# Patient Record
Sex: Male | Born: 1965 | Race: Black or African American | Hispanic: No | Marital: Single | Smoking: Current every day smoker
Health system: Southern US, Community
[De-identification: ages and names within clinical notes are randomized; demographics above are authoritative.]

## PROBLEM LIST (undated history)

## (undated) DIAGNOSIS — K219 Gastro-esophageal reflux disease without esophagitis: Secondary | ICD-10-CM

## (undated) DIAGNOSIS — Z8639 Personal history of other endocrine, nutritional and metabolic disease: Secondary | ICD-10-CM

## (undated) DIAGNOSIS — D649 Anemia, unspecified: Secondary | ICD-10-CM

## (undated) DIAGNOSIS — N185 Chronic kidney disease, stage 5: Secondary | ICD-10-CM

## (undated) DIAGNOSIS — R9431 Abnormal electrocardiogram [ECG] [EKG]: Secondary | ICD-10-CM

## (undated) DIAGNOSIS — I10 Essential (primary) hypertension: Secondary | ICD-10-CM

## (undated) DIAGNOSIS — N189 Chronic kidney disease, unspecified: Secondary | ICD-10-CM

## (undated) DIAGNOSIS — N179 Acute kidney failure, unspecified: Secondary | ICD-10-CM

## (undated) HISTORY — PX: ROTATOR CUFF REPAIR: SHX139

## (undated) HISTORY — DX: Chronic kidney disease, stage 5: N18.5

## (undated) HISTORY — DX: Personal history of other endocrine, nutritional and metabolic disease: Z86.39

## (undated) HISTORY — DX: Gastro-esophageal reflux disease without esophagitis: K21.9

## (undated) HISTORY — DX: Abnormal electrocardiogram (ECG) (EKG): R94.31

## (undated) HISTORY — PX: HERNIA REPAIR: SHX51

## (undated) HISTORY — DX: Anemia, unspecified: D64.9

## (undated) HISTORY — DX: Chronic kidney disease, unspecified: N18.9

## (undated) HISTORY — DX: Acute kidney failure, unspecified: N17.9

## (undated) HISTORY — PX: PORTA CATH REMOVAL: CATH118286

---

## 2011-03-30 ENCOUNTER — Emergency Department (HOSPITAL_BASED_OUTPATIENT_CLINIC_OR_DEPARTMENT_OTHER)
Admission: EM | Admit: 2011-03-30 | Discharge: 2011-03-30 | Disposition: A | Discharge status: Home / Self Care | Payer: No Typology Code available for payment source | Attending: Emergency Medicine | Admitting: Emergency Medicine

## 2011-03-30 ENCOUNTER — Encounter (HOSPITAL_BASED_OUTPATIENT_CLINIC_OR_DEPARTMENT_OTHER): Payer: Self-pay | Admitting: Emergency Medicine

## 2011-03-30 ENCOUNTER — Emergency Department (INDEPENDENT_AMBULATORY_CARE_PROVIDER_SITE_OTHER): Payer: No Typology Code available for payment source

## 2011-03-30 DIAGNOSIS — S0120XA Unspecified open wound of nose, initial encounter: Secondary | ICD-10-CM

## 2011-03-30 DIAGNOSIS — Y92410 Unspecified street and highway as the place of occurrence of the external cause: Secondary | ICD-10-CM | POA: Insufficient documentation

## 2011-03-30 DIAGNOSIS — Z043 Encounter for examination and observation following other accident: Secondary | ICD-10-CM

## 2011-03-30 DIAGNOSIS — S01502A Unspecified open wound of oral cavity, initial encounter: Secondary | ICD-10-CM | POA: Insufficient documentation

## 2011-03-30 DIAGNOSIS — R6884 Jaw pain: Secondary | ICD-10-CM

## 2011-03-30 DIAGNOSIS — S01501A Unspecified open wound of lip, initial encounter: Secondary | ICD-10-CM | POA: Insufficient documentation

## 2011-03-30 DIAGNOSIS — S01511A Laceration without foreign body of lip, initial encounter: Secondary | ICD-10-CM

## 2011-03-30 DIAGNOSIS — S01512A Laceration without foreign body of oral cavity, initial encounter: Secondary | ICD-10-CM

## 2011-03-30 DIAGNOSIS — F17200 Nicotine dependence, unspecified, uncomplicated: Secondary | ICD-10-CM | POA: Insufficient documentation

## 2011-03-30 MED ORDER — AMOXICILLIN 500 MG PO CAPS: 500.0000 mg | ORAL_CAPSULE | Freq: Three times a day (TID) | ORAL | Status: AC

## 2011-03-30 MED ORDER — LIDOCAINE-EPINEPHRINE-TETRACAINE (LET) SOLUTION
3.0000 mL | Freq: Once | NASAL | Status: AC
  Administered 2011-03-30: 3 mL via TOPICAL
  Filled 2011-03-30: qty 3

## 2011-03-30 MED ORDER — OXYCODONE-ACETAMINOPHEN 5-325 MG PO TABS
2.0000 | ORAL_TABLET | Freq: Once | ORAL | Status: AC
  Administered 2011-03-30: 2 via ORAL
  Filled 2011-03-30: qty 2

## 2011-03-30 MED ORDER — OXYCODONE-ACETAMINOPHEN 5-325 MG PO TABS: 2.0000 | ORAL_TABLET | ORAL | Status: AC | PRN
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
## 2011-04-02 NOTE — ED Provider Notes (Signed)
Medical screening examination/treatment/procedure(s) were performed by non-physician practitioner and as supervising physician I was immediately available for consultation/collaboration.   Mervin Kung, MD 04/02/11 2006

## 2012-06-24 ENCOUNTER — Emergency Department (HOSPITAL_COMMUNITY)
Admission: EM | Admit: 2012-06-24 | Discharge: 2012-06-24 | Disposition: A | Discharge status: Home / Self Care | Payer: Managed Care, Other (non HMO) | Attending: Emergency Medicine | Admitting: Emergency Medicine

## 2012-06-24 ENCOUNTER — Emergency Department (HOSPITAL_COMMUNITY): Payer: Managed Care, Other (non HMO)

## 2012-06-24 ENCOUNTER — Encounter (HOSPITAL_COMMUNITY): Payer: Self-pay | Admitting: *Deleted

## 2012-06-24 DIAGNOSIS — R209 Unspecified disturbances of skin sensation: Secondary | ICD-10-CM | POA: Insufficient documentation

## 2012-06-24 DIAGNOSIS — N289 Disorder of kidney and ureter, unspecified: Secondary | ICD-10-CM | POA: Insufficient documentation

## 2012-06-24 DIAGNOSIS — I10 Essential (primary) hypertension: Secondary | ICD-10-CM | POA: Insufficient documentation

## 2012-06-24 DIAGNOSIS — R200 Anesthesia of skin: Secondary | ICD-10-CM

## 2012-06-24 DIAGNOSIS — F17200 Nicotine dependence, unspecified, uncomplicated: Secondary | ICD-10-CM | POA: Insufficient documentation

## 2012-06-24 HISTORY — DX: Essential (primary) hypertension: I10

## 2012-06-24 LAB — COMPREHENSIVE METABOLIC PANEL
ALT: 30 U/L (ref 0–53)
AST: 32 U/L (ref 0–37)
Albumin: 3.4 g/dL — ABNORMAL LOW (ref 3.5–5.2)
Alkaline Phosphatase: 65 U/L (ref 39–117)
BUN: 12 mg/dL (ref 6–23)
CO2: 27 mEq/L (ref 19–32)
Calcium: 9.4 mg/dL (ref 8.4–10.5)
Chloride: 103 mEq/L (ref 96–112)
Creatinine, Ser: 1.68 mg/dL — ABNORMAL HIGH (ref 0.50–1.35)
GFR calc Af Amer: 55 mL/min — ABNORMAL LOW (ref >90)
GFR calc non Af Amer: 48 mL/min — ABNORMAL LOW (ref >90)
Glucose, Bld: 78 mg/dL (ref 70–99)
Potassium: 4.4 mEq/L (ref 3.5–5.1)
Sodium: 139 mEq/L (ref 135–145)
Total Bilirubin: 0.3 mg/dL (ref 0.3–1.2)
Total Protein: 7.9 g/dL (ref 6.0–8.3)

## 2012-06-24 LAB — GLUCOSE, CAPILLARY: Glucose-Capillary: 86 mg/dL (ref 70–99)

## 2012-06-24 LAB — CBC
HCT: 48.1 % (ref 39.0–52.0)
Hemoglobin: 16 g/dL (ref 13.0–17.0)
MCH: 27 pg (ref 26.0–34.0)
MCHC: 33.3 g/dL (ref 30.0–36.0)
MCV: 81.3 fL (ref 78.0–100.0)
Platelets: 207 10*3/uL (ref 150–400)
RBC: 5.92 MIL/uL — ABNORMAL HIGH (ref 4.22–5.81)
RDW: 14.6 % (ref 11.5–15.5)
WBC: 5.2 10*3/uL (ref 4.0–10.5)

## 2012-06-24 LAB — DIFFERENTIAL
Basophils Absolute: 0 10*3/uL (ref 0.0–0.1)
Basophils Relative: 1 % (ref 0–1)
Eosinophils Absolute: 0.2 10*3/uL (ref 0.0–0.7)
Eosinophils Relative: 3 % (ref 0–5)
Lymphocytes Relative: 41 % (ref 12–46)
Lymphs Abs: 2.1 10*3/uL (ref 0.7–4.0)
Monocytes Absolute: 0.5 10*3/uL (ref 0.1–1.0)
Monocytes Relative: 10 % (ref 3–12)
Neutro Abs: 2.4 10*3/uL (ref 1.7–7.7)
Neutrophils Relative %: 46 % (ref 43–77)

## 2012-06-24 LAB — PROTIME-INR
INR: 0.88 (ref 0.00–1.49)
Prothrombin Time: 11.9 seconds (ref 11.6–15.2)

## 2012-06-24 LAB — ETHANOL: Alcohol, Ethyl (B): <11 mg/dL (ref 0–11)

## 2012-06-24 LAB — POCT I-STAT TROPONIN I

## 2012-06-24 LAB — APTT: aPTT: 30 seconds (ref 24–37)

## 2012-06-24 LAB — TROPONIN I: Troponin I: <0.3 ng/mL (ref <0.30)

## 2012-06-24 MED ORDER — HYDROCHLOROTHIAZIDE 12.5 MG PO TABS: 12.5000 mg | ORAL_TABLET | Freq: Every day | ORAL | Status: DC

## 2012-06-24 MED ORDER — ASPIRIN 81 MG PO CHEW: 325.0000 mg | CHEWABLE_TABLET | Freq: Every day | ORAL | Status: DC

## 2012-06-24 NOTE — ED Notes (Signed)
Past stroke shallow screen

## 2012-06-24 NOTE — ED Notes (Signed)
Pt states yesterday morning early (2:45 am) was at work when his L hand went numb then his arm went numb, states lasted about 3-4 seconds, also states L side of face felt "heavy", states happen only that one time for the 3-4 seconds, states has been told has high blood pressure and has been threatened with medication but has been trying to control w/ diet and exercise. Denies n/v, denies shortness of breath/dizziness.

## 2012-06-24 NOTE — ED Provider Notes (Signed)
Medical screening examination/treatment/procedure(s) were conducted as a shared visit with non-physician practitioner(s) and myself.  I personally evaluated the patient during the encounter.  47yo male asymptomatic today, several seconds yesterday left face/arm numbness, doubt SAH/CVA/HTN crisis, noted to have elevated BP, renal insufficiency, and abnormal ECG, [facility] Kindred recs [facility] Neuro f/u low likelihood TIA, might even have been focal seizure but uncertain.  ECG: Sinus rhythm, ventricular rate 56, normal axis, RSR prime in lead V1, nonspecific T wave abnormality laterally, slight diffuse ST elevation precordial leads, no comparison ECG available, clinically doubt acute coronary syndrome or STEMI  Babette Relic, MD 06/25/12 1436

## 2012-06-24 NOTE — ED Notes (Signed)
Patient transported to CT 

## 2012-06-24 NOTE — ED Notes (Signed)
[id number] Expected date:<BR> Expected time:<BR> Means of arrival:<BR> Comments:<BR> ems-hallucinations

## 2012-06-24 NOTE — ED Provider Notes (Signed)
History     CSN: [id number]  Arrival date & time 06/24/12  1246   First MD Initiated Contact with Patient 06/24/12 1346      Chief Complaint  Patient presents with  . Numbness    (Consider location/radiation/quality/duration/timing/severity/associated sxs/prior treatment) HPI Pt is a 47yo male presenting after brief episode of left sided face numbness and left hand and arm numbness that occurred yesterday.  Episode occurred at work around 02:45 in the morning, only lasted 3-4 seconds.  Pt has been told in past he has high blood pressure but is not on any medications.  Has tried to control with diet and exercise.  Does not have a regular PCP.  Denies any symptoms at this time.  Denies n/v, headache, dizziness, chest pain or SOB.    Past Medical History  Diagnosis Date  . Hypertension     Past Surgical History  Procedure Laterality Date  . Hernia repair    . Rotator cuff repair      History reviewed. No pertinent family history.  History  Substance Use Topics  . Smoking status: Current Every Day Smoker    Types: Cigars  . Smokeless tobacco: Never Used  . Alcohol Use: Yes      Review of Systems  Constitutional: Negative for fever and chills.  Respiratory: Negative for chest tightness.   Cardiovascular: Negative for chest pain.  Neurological: Positive for numbness. Negative for syncope, weakness and headaches.  All other systems reviewed and are negative.    Allergies  Review of patient's allergies indicates no known allergies.  Home Medications   Current Outpatient Rx  Name  Route  Sig  Dispense  Refill  . acetaminophen (TYLENOL) 500 MG tablet   Oral   Take 1,000 mg by mouth every 6 (six) hours as needed for pain.         Marland Kitchen aspirin 81 MG chewable tablet   Oral   Chew 4 tablets (325 mg total) by mouth daily.   30 tablet   0   . hydrochlorothiazide (HYDRODIURIL) 12.5 MG tablet   Oral   Take 1 tablet (12.5 mg total) by mouth daily.   30 tablet   1      BP 169/109  Pulse 75  Temp(Src) 97.3 F (36.3 C) (Oral)  Resp 18  SpO2 99%  Physical Exam  Nursing note and vitals reviewed. Constitutional: He is oriented to person, place, and time. He appears well-developed and well-nourished. No distress.  Pt sitting up in exam bed. NAD.  Appears well.  HENT:  Head: Normocephalic and atraumatic.  Mouth/Throat: Oropharynx is clear and moist. No oropharyngeal exudate.  Eyes: Conjunctivae and EOM are normal. Pupils are equal, round, and reactive to light. Right eye exhibits no discharge. Left eye exhibits no discharge. No scleral icterus.  Neck: Normal range of motion.  No nuchal rigidity   Cardiovascular: Normal rate, regular rhythm and normal heart sounds.   Pulmonary/Chest: Effort normal and breath sounds normal. No respiratory distress. He has no wheezes. He has no rales. He exhibits no tenderness.  Abdominal: Soft. Bowel sounds are normal. He exhibits no distension and no mass. There is no tenderness. There is no rebound and no guarding.  Musculoskeletal: Normal range of motion. He exhibits no tenderness.  Neurological: He is alert and oriented to person, place, and time. He has normal strength. No cranial nerve deficit or sensory deficit. He displays a negative Romberg sign. Coordination and gait normal. GCS eye subscore is 4. GCS verbal  subscore is 5. GCS motor subscore is 6.  Pt speaking in full paragraphs, no slurred speech.  CN II-XII in tact.  Nl sensation and strength. No focal deficit. Nl coordination. Neg romberg. Nl gait.   Skin: Skin is warm and dry. He is not diaphoretic.  Psychiatric: He has a normal mood and affect. His behavior is normal. Judgment and thought content normal.    ED Course  Procedures (including critical care time)  Labs Reviewed  CBC - Abnormal; Notable for the following:    RBC 5.92 (*)    All other components within normal limits  COMPREHENSIVE METABOLIC PANEL - Abnormal; Notable for the following:     Creatinine, Ser 1.68 (*)    Albumin 3.4 (*)    GFR calc non Af Amer 48 (*)    GFR calc Af Amer 55 (*)    All other components within normal limits  PROTIME-INR  APTT  DIFFERENTIAL  TROPONIN I  ETHANOL  GLUCOSE, CAPILLARY  POCT I-STAT TROPONIN I   Ct Head Wo Contrast  06/24/2012  *RADIOLOGY REPORT*  Clinical Data:  Left hand and facial numbness.  CT HEAD WITHOUT CONTRAST  Technique:  Contiguous axial images were obtained from the base of the skull through the vertex without contrast  Comparison:  CT 03/30/2011  Findings:  The brain has a normal appearance without evidence for hemorrhage, acute infarction, hydrocephalus, or mass lesion.  There is no extra axial fluid collection.  The skull and paranasal sinuses are normal.  IMPRESSION: Normal CT of the head without contrast.   Original Report Authenticated By: Carl Best, M.D.      1. HTN (hypertension)   2. Numbness   3. Renal insufficiency       MDM  Pt is a 47yo male presenting after brief episode of left facial and left hand numbness yesterday.  Not c/o symptoms at this time.  Hx of high blood pressure but not on medication.  Does not have routine PCP.  Otherwise healthy.  Denies n/v, chest pain, sob, headache, or dizziness.  PE: benign. Neuro exam nl.  CN II-XII in tact. No focal deficit. PERRL EOM in tact. Neg romberg and nl gait. Due to stroke-like symptoms yesterday, concern for TIA.  Discussed pt with Dr. Stevie Kern. Will perform stroke workup and consult neurology.   CMP: Cr-1.68, GFR-55  Concern for acute vs chronic renal failure CT head: nl  Dr. Marrianne Mood consulted neurology who advised pt f/u with [facility].  Start on HCTZ and daily aspirin.  Rx: HCTZ 12.5mg  and Aspirin 325mg   Vitals: unremarkable. Discharged in stable condition.    Discussed pt with attending during ED encounter.          Noland Fordyce, PA-C 06/24/12 1810

## 2012-07-16 ENCOUNTER — Encounter: Payer: Self-pay | Admitting: Cardiovascular Disease

## 2012-07-16 ENCOUNTER — Ambulatory Visit (INDEPENDENT_AMBULATORY_CARE_PROVIDER_SITE_OTHER): Payer: Managed Care, Other (non HMO) | Admitting: Cardiovascular Disease

## 2012-07-16 VITALS — BP 130/80 | HR 84 | Ht 76.0 in | Wt 283.0 lb

## 2012-07-16 DIAGNOSIS — N189 Chronic kidney disease, unspecified: Secondary | ICD-10-CM

## 2012-07-16 DIAGNOSIS — I10 Essential (primary) hypertension: Secondary | ICD-10-CM | POA: Insufficient documentation

## 2012-07-16 DIAGNOSIS — R9431 Abnormal electrocardiogram [ECG] [EKG]: Secondary | ICD-10-CM

## 2012-07-16 DIAGNOSIS — G459 Transient cerebral ischemic attack, unspecified: Secondary | ICD-10-CM

## 2012-07-16 HISTORY — DX: Abnormal electrocardiogram (ECG) (EKG): R94.31

## 2012-07-16 HISTORY — DX: Chronic kidney disease, unspecified: N18.9

## 2012-07-16 NOTE — Assessment & Plan Note (Signed)
He has a referral to neuro and I encouraged him to keep this.  May need MRI and EEG Dont think carotids needed given young age and lack of bruit

## 2012-07-16 NOTE — Assessment & Plan Note (Signed)
Likely LVH from unRx HTN  Echo to assess LV function and thicness and r/o SOE

## 2012-07-16 NOTE — Assessment & Plan Note (Signed)
Improved with diuretic but ACE or calcium blocker may be better choice.  Needs f/u BMET  Will leave this up to Dr Larose Kells who will see him as primary soon

## 2012-07-16 NOTE — Progress Notes (Signed)
Patient ID: Chad Adkins, male   DOB: [birth year], 47 y.o.   MRN: [id number] 47 yo referred by ER with recent visit 5/5 with ? TIA and abnormal ECG   He felt  face/arm numbness with  elevated BP, renal insufficiency, and abnormal ECG Dr Nicole Kindred neurology recommended outpatient  f/u low likelihood TIA, Thought it might have been focal seizure but uncertain. CT of head was normal   Cr was 1.68  Has not f/u with neuro or primary.  No history of cardiac issues. Originally form [place]  Seems to have poor insight into his diagnosis of HTN and has not been Rx effectively.  Discussed importance of this  And issue of renal insuficiency already No recurrent numbness in arm and face.  Denies palpitations chest pain history of CHF.  Denies seizures. Works out at [employer] Denies steroid use. Does use some supplements like [employer] ? ingredients  ECG: 5/5 revewed  Sinus rhythm, ventricular rate 56, normal axis, RSR prime in lead V1, early repolarization in precordial leads  ROS: Denies fever, malais, weight loss, blurry vision, decreased visual acuity, cough, sputum, SOB, hemoptysis, pleuritic pain, palpitaitons, heartburn, abdominal pain, melena, lower extremity edema, claudication, or rash.  All other systems reviewed and negative   General: Affect appropriate Muscular black male HEENT: normal Neck supple with no adenopathy JVP normal no bruits no thyromegaly Lungs clear with no wheezing and good diaphragmatic motion Heart:  S1/S2 no murmur,rub, gallop or click PMI normal Abdomen: benighn, BS positve, no tenderness, no AAA no bruit.  No HSM or HJR Distal pulses intact with no bruits No edema Neuro non-focal Skin warm and dry No muscular weakness  Medications Current Outpatient Prescriptions  Medication Sig Dispense Refill  . acetaminophen (TYLENOL) 500 MG tablet Take 1,000 mg by mouth every 6 (six) hours as needed for pain.      . hydrochlorothiazide (HYDRODIURIL) 12.5 MG tablet Take 1 tablet  (12.5 mg total) by mouth daily.  30 tablet  1  . aspirin 81 MG chewable tablet Chew 4 tablets (325 mg total) by mouth daily.  30 tablet  0   No current facility-administered medications for this visit.    Allergies Review of patient's allergies indicates no known allergies.  Family History: No family history on file.  Social History: History   Social History  . Marital Status: Single    Spouse Name: N/A    Number of Children: N/A  . Years of Education: N/A   Occupational History  . Not on file.   Social History Main Topics  . Smoking status: Current Every Day Smoker    Types: Cigars  . Smokeless tobacco: Never Used  . Alcohol Use: Yes  . Drug Use: No  . Sexually Active: Not on file   Other Topics Concern  . Not on file   Social History Narrative  . No narrative on file    Electrocardiogram:  See HPI   Assessment and Plan

## 2012-07-16 NOTE — Patient Instructions (Signed)
Your physician has requested that you have an echocardiogram. Echocardiography is a painless test that uses sound waves to create images of your heart. It provides your doctor with information about the size and shape of your heart and how well your heart's chambers and valves are working. This procedure takes approximately one hour. There are no restrictions for this procedure.  Your physician recommends that you continue on your current medications as directed. Please refer to the Current Medication list given to you today.  Your physician recommends that you schedule a follow-up appointment in: as needed  Your provider is referring you to Dr. Larose Kells, a primary care MD.

## 2012-07-16 NOTE — Assessment & Plan Note (Signed)
Discussed this at length with patient who was unaware of issue.  Needs f/u BMET on diuretic.  Would consider f/u renal duplex and 24 hour urine  Rx of HTN and low sodium diet.

## 2012-07-22 ENCOUNTER — Encounter: Payer: Self-pay | Admitting: Internal Medicine

## 2012-07-22 ENCOUNTER — Ambulatory Visit (INDEPENDENT_AMBULATORY_CARE_PROVIDER_SITE_OTHER): Payer: Managed Care, Other (non HMO) | Admitting: Internal Medicine

## 2012-07-22 VITALS — BP 144/92 | HR 81 | Temp 98.1°F | Ht 75.0 in | Wt 285.0 lb

## 2012-07-22 DIAGNOSIS — I10 Essential (primary) hypertension: Secondary | ICD-10-CM

## 2012-07-22 DIAGNOSIS — G459 Transient cerebral ischemic attack, unspecified: Secondary | ICD-10-CM

## 2012-07-22 DIAGNOSIS — N189 Chronic kidney disease, unspecified: Secondary | ICD-10-CM

## 2012-07-22 LAB — BASIC METABOLIC PANEL
Calcium: 9.3 mg/dL (ref 8.4–10.5)
GFR: 42.41 mL/min — ABNORMAL LOW (ref >60.00)
Glucose, Bld: 86 mg/dL (ref 70–99)
Sodium: 137 mEq/L (ref 135–145)

## 2012-07-22 MED ORDER — AMLODIPINE BESYLATE 5 MG PO TABS: 5.0000 mg | ORAL_TABLET | Freq: Every day | ORAL | Status: DC

## 2012-07-22 NOTE — Progress Notes (Signed)
  Subjective:    Patient ID: Chad Adkins, male    DOB: March 08, 1965, 47 y.o.   MRN: [id number]  HPI Patient, chart reviewed. Went to the ER 06/24/2012 after a 5 second episode of numbness at the left hand and  heaviness in the left face.Symptoms self resolved, they have not come back. At the ER CT of the head was negative, creatinine was noted to be 1.68, they started hydrochlorothiazide 12.5 mg daily and recommended an outpatient neurology referral. Also, EKG was abnormal, he saw cardiology, they recommend an echocardiogram which is scheduled for tomorrow. Today I reviewed the ER and cards OV notes and the ER labs and XRs  Past Medical History  Diagnosis Date  . Hypertension    Past Surgical History  Procedure Laterality Date  . Hernia repair    . Rotator cuff repair Right    History   Social History  . Marital Status: Single    Spouse Name: N/A    Number of Children: 2  . Years of Education: N/A   Occupational History  . CUSTOMER RELATIONS     [employer]   . Part time @ [employer] History Main Topics  . Smoking status: Current Every Day Smoker    Types: Cigars  . Smokeless tobacco: Never Used     Comment: daily  . Alcohol Use: Yes     Comment: socially   . Drug Use: No  . Sexually Active: Not on file   Other Topics Concern  . Not on file   Social History Narrative   Lives by himself    Family History  Problem Relation Age of Onset  . CAD Neg Hx   . Diabetes Neg Hx   . Stroke Neg Hx   . Colon cancer Neg Hx   . Prostate cancer Neg Hx   . Hyperlipidemia Father   . Hypertension Mother   . Breast cancer Mother     Review of Systems He knew he has elevated blood pressure for at least 2 years, has never been treated. Was never told before that his creatinine was increased. Denies any chest pain, palpitations. No headaches, nausea, vomiting. No neck pain or diplopia. No slurred speech.    Objective:   Physical Exam BP 144/92  Pulse 81   Temp(Src) 98.1 F (36.7 C) (Oral)  Ht 6\' 3"  (1.905 m)  Wt 285 lb (129.275 kg)  BMI 35.62 kg/m2  SpO2 97%  General -- alert, well-developed, NAD  Neck --no thyromegaly , normal carotid pulse, no carotid bruit  Lungs -- normal respiratory effort, no intercostal retractions, no accessory muscle use, and normal breath sounds.   Heart-- normal rate, regular rhythm, no murmur, and no gallop.   Abdomen--soft, non-tender, no distention, no masses, no bruit   Extremities-- no pretibial edema bilaterally Neurologic-- alert & oriented X3 , gait normal, speech fluent-normal, strength normal in all extremities. Psych-- Cognition and judgment appear intact. Alert and cooperative with normal attention span and concentration.  not anxious appearing and not depressed appearing.       Assessment & Plan:

## 2012-07-22 NOTE — Assessment & Plan Note (Addendum)
creatinine 1.68 on  06-24-12, Baseline?Marland Kitchen We will recheck a BMP noting he started HCTZ few weeks ago. Wonders if he could take protein supplements, I advised against them. Consider  renal ultrasound. Add amlodipine, like to see BP in the 120/80 range

## 2012-07-22 NOTE — Assessment & Plan Note (Signed)
Symptoms as described in the history of present illness, unclear if he had a TIA. Arrange a neurology referral.

## 2012-07-22 NOTE — Assessment & Plan Note (Addendum)
History hypertension for at least 2 years, never been treated for BP until visit to the ER 06/24/2012. Currently on HCTZ qd  Discussed complications of hypertension including strokes, CHF, CAD, death and disability . Treatment includes a low-salt diet, exercise, medication and frequent monitoring. Patient verbalized understanding. BP goal 120/80. Plan: BMP, TSH, add amlodipine.

## 2012-07-22 NOTE — Patient Instructions (Addendum)
Come back in 3 months, fasting Check the  blood pressure 2 or 3 times a week, be sure it is between 110/60 and 140/85. If it is consistently higher or lower, let me know

## 2012-07-23 ENCOUNTER — Ambulatory Visit (HOSPITAL_COMMUNITY): Payer: Managed Care, Other (non HMO) | Attending: Cardiology | Admitting: Radiology

## 2012-07-23 DIAGNOSIS — R9431 Abnormal electrocardiogram [ECG] [EKG]: Secondary | ICD-10-CM

## 2012-07-23 DIAGNOSIS — I10 Essential (primary) hypertension: Secondary | ICD-10-CM | POA: Insufficient documentation

## 2012-07-23 NOTE — Progress Notes (Signed)
Echocardiogram performed.  

## 2012-07-26 ENCOUNTER — Telehealth: Payer: Self-pay | Admitting: Internal Medicine

## 2012-07-26 DIAGNOSIS — I10 Essential (primary) hypertension: Secondary | ICD-10-CM

## 2012-07-26 MED ORDER — CARVEDILOL 12.5 MG PO TABS: 12.5000 mg | ORAL_TABLET | Freq: Two times a day (BID) | ORAL | Status: DC

## 2012-07-26 NOTE — Telephone Encounter (Signed)
Patient is calling to get his lab results from his recent labs on 07/22/2012.

## 2012-07-26 NOTE — Telephone Encounter (Signed)
Discussed with pt, sent rx to pharmacy & entered orders.

## 2012-07-30 ENCOUNTER — Telehealth: Payer: Self-pay | Admitting: Internal Medicine

## 2012-07-30 NOTE — Addendum Note (Signed)
Addended by: Douglass Rivers T on: 07/30/2012 09:00 AM   Modules accepted: Orders

## 2012-07-30 NOTE — Telephone Encounter (Signed)
Pt.notified

## 2012-07-30 NOTE — Telephone Encounter (Signed)
Please advise 

## 2012-07-30 NOTE — Telephone Encounter (Signed)
Patient would like to know if it is okay for him to lift weights. He has ultrasound scheduled for Friday.

## 2012-07-30 NOTE — Telephone Encounter (Signed)
I recommend a light cardio exercise rather than lifting weights

## 2012-08-02 ENCOUNTER — Ambulatory Visit
Admission: RE | Admit: 2012-08-02 | Discharge: 2012-08-02 | Disposition: A | Discharge status: Home / Self Care | Payer: Managed Care, Other (non HMO) | Source: Ambulatory Visit | Attending: Internal Medicine | Admitting: Internal Medicine

## 2012-08-02 ENCOUNTER — Other Ambulatory Visit: Payer: Managed Care, Other (non HMO)

## 2012-08-02 DIAGNOSIS — I10 Essential (primary) hypertension: Secondary | ICD-10-CM

## 2012-08-05 ENCOUNTER — Encounter: Payer: Self-pay | Admitting: *Deleted

## 2012-08-09 ENCOUNTER — Telehealth: Payer: Self-pay | Admitting: *Deleted

## 2012-08-09 NOTE — Telephone Encounter (Signed)
Pt would like to know if he can return to the GYM with no restriction.Please advise

## 2012-08-09 NOTE — Telephone Encounter (Signed)
As long as he feels well and his BP is less than 140/80 I think is okay, needs to drink plenty of fluids. He   has an appointment scheduled for 10-2012 but I like to see him next month, please arrange.

## 2012-08-09 NOTE — Telephone Encounter (Signed)
Discussed with pt, scheduled appt 7.30.14

## 2012-08-13 ENCOUNTER — Telehealth: Payer: Self-pay | Admitting: *Deleted

## 2012-08-13 NOTE — Telephone Encounter (Signed)
PT AWARE OF ECHO RESULTS./CY 

## 2012-08-13 NOTE — Telephone Encounter (Signed)
Follow Up     Pt calling back returning call. Please call back.

## 2012-08-13 NOTE — Telephone Encounter (Signed)
         Chad Adkins ','<More Detail >>       Josue Hector, MD       Sent: Mon August 12, 2012 5:46 PM    To: Richmond Campbell, LPN                   Message     Just moderate LVH othewise echo normal    ----- Message -----    From: Richmond Campbell, LPN    Sent: [id number] 1:54 PM    To: Josue Hector, MD        NOT SURE HAS BEEN REVIEWED./CY                 Attached Reports    The sender attached the following reports to this message:

## 2012-08-13 NOTE — Telephone Encounter (Signed)
Pt returning your call

## 2012-08-21 ENCOUNTER — Ambulatory Visit (INDEPENDENT_AMBULATORY_CARE_PROVIDER_SITE_OTHER): Payer: Managed Care, Other (non HMO) | Admitting: Neurology

## 2012-08-21 ENCOUNTER — Encounter: Payer: Self-pay | Admitting: Neurology

## 2012-08-21 VITALS — BP 134/84 | HR 80 | Temp 98.2°F | Ht 74.5 in | Wt 286.0 lb

## 2012-08-21 DIAGNOSIS — R002 Palpitations: Secondary | ICD-10-CM

## 2012-08-21 DIAGNOSIS — G459 Transient cerebral ischemic attack, unspecified: Secondary | ICD-10-CM

## 2012-08-21 NOTE — Progress Notes (Addendum)
NEUROLOGY CONSULTATION NOTE  Chad Adkins [id number] DOB: [birth year]  Referring physician: Dr. Larose Kells Primary care physician: Dr. Larose Kells  Reason for consult:  Transient left hand and face numbness  HISTORY OF PRESENT ILLNESS: Chad Adkins is a 47 y.o. right handed malewith remote history of hypercholesterolemia who presents for the evaluation of transient left hand numbness and left facial heaviness. On 06/24/12, patient developed sudden onset of numbness in his fingertips that then included his entire left hand, as well as a sensation of heaviness on the left side of his face. The event lasted only 4-5 seconds and completely resolved. He did not experience any language deficits, slurred speech, focal weakness of the extremities, chest pain or palpitations.He went to the emergency room where he was evaluated by your physicians. He was found to have a blood pressure of 169/109. CAT scan of the brain was performed and was reviewed. It was unremarkable. He did have a creatinine of 1.68. troponins were negative.  A renal ultrasound was unremarkable. He did have an abnormal ECG, which showed a sinus rhythm of 56 bpm, with RSR prime in lead V1 and early repolarization in the precordial leads.. He was started on hydrochlorothiazide and 81 mg of aspirin. The case was discussed with neurology who recommended outpatient followup. It was also suggested that he may have had a small focal seizure as well.  He was later evaluated by cardiology.a 2D echocardiogram was performed, which revealed a LVEF of 55-60%.he has changed his diet, and is now eating a low sodium, low fat diet. He is on light exercise as per restrictions from his physicians. Other blood work included TSH 0.86, creatinine 1.8, and potassium 3.3. He is still trying to get his blood pressure under more optimal control. Goal is 120/80. So far, he says it has been in the 140s/90s range.    He does smoke cigars but not cigarettes. He smokes about one cigar a  day and approximately 4 cigars over the weekends.  He says that he does have a prior history of high cholesterol. He is a family history of hypertension but no history of stroke. Since the incident, he has felt fine.  He's currently on ASA, Coreg, HCTZ and Norvasc.   PAST MEDICAL HISTORY: Past Medical History  Diagnosis Date  . Hypertension     PAST SURGICAL HISTORY: Past Surgical History  Procedure Laterality Date  . Hernia repair    . Rotator cuff repair Right     MEDICATIONS: Current Outpatient Prescriptions on File Prior to Visit  Medication Sig Dispense Refill  . acetaminophen (TYLENOL) 500 MG tablet Take 1,000 mg by mouth every 6 (six) hours as needed for pain.      Marland Kitchen amLODipine (NORVASC) 5 MG tablet Take 1 tablet (5 mg total) by mouth daily.  30 tablet  3  . aspirin 81 MG chewable tablet Chew 81 mg by mouth daily.      . carvedilol (COREG) 12.5 MG tablet Take 1 tablet (12.5 mg total) by mouth 2 (two) times daily with a meal.  180 tablet  0  . hydrochlorothiazide (HYDRODIURIL) 12.5 MG tablet Take 1 tablet (12.5 mg total) by mouth daily.  30 tablet  1   No current facility-administered medications on file prior to visit.    ALLERGIES: No Known Allergies  FAMILY HISTORY: Family History  Problem Relation Age of Onset  . CAD Neg Hx   . Diabetes Neg Hx   . Stroke Neg Hx   . Colon cancer Neg  Hx   . Prostate cancer Neg Hx   . Hyperlipidemia Father   . Hypertension Mother   . Breast cancer Mother     SOCIAL HISTORY: History   Social History  . Marital Status: Single    Spouse Name: N/A    Number of Children: 2  . Years of Education: N/A   Occupational History  . CUSTOMER RELATIONS     [employer]   . Part time @ [employer] History Main Topics  . Smoking status: Current Every Day Smoker    Types: Cigars  . Smokeless tobacco: Never Used     Comment: daily  . Alcohol Use: Yes     Comment: socially   . Drug Use: No  . Sexually Active: Not on  file   Other Topics Concern  . Not on file   Social History Narrative   Lives by himself     PHYSICAL EXAM: Filed Vitals:   08/21/12 0846  BP: 134/84  Pulse: 80  Temp: 98.2 F (36.8 C)   General: No acute distress Head:  Normocephalic/atraumatic Neck: supple, no paraspinal tenderness, full range of motion Back: No paraspinal tenderness Heart: regular rate and rhythm Lungs: Clear to auscultation bilaterally. Neurological Exam: Mental status: alert and oriented to person, place, time and self, speech fluent and not dysarthric, language intact. Cranial nerves: CN I: not tested CN II: visual fields intact CN III, IV, VI: pupils mildly asymmetric (77mm OD, 2.22mm OS) but reactive, full range of motion, no nystagmus, fundi unremarkable CN V: facial sensation intact CN VII: upper and lower face symmetric CN VIII: hearing intact CN IX, X: gag intact, uvula midline CN XI: sternocleidomastoid and trapezius muscles intact CN XII: tongue midline Bulk & Tone: normal, no fasciculations. Muscle strength: 5/5 throughout Sensation: pinprick and vibration intact Deep Tendon Reflexes: 2+ throughout Finger to nose testing: normal Gait: normal, able to walk on toes, heels, and in tandem. Romberg negative.  IMPRESSION & PLAN: Chad Adkins is a 47 y.o. male with brief episode of left hand numbness and left facial "heaviness". I think that he probably had a very mild transient ischemic attack. I don't really think he had a focal seizure. Due to his young age, I think a more thorough stroke workup is warranted. 1.  MRI of brain to look for any pathology to suggest stroke. 2.  Carotid dopplers to look for any significant stenosis 3.  Check hypercoagulable workup.  He said he had a lipid panel performed, but I do not see it in the chart.  We will call [facility] for these results. 4.  24 hour Holter monitor to look for any paroxysmal atrial fibrillation 5.  Continue ASA 81mg  daily 6.  Continue  optimizing BP control as per PCP and cardiology 7.  Continue Mediterranean diet 8.  Discussed limiting cigar use.  60 minutes spent with patient, over 50% spent counseling and coordinating care.  Metta Clines, DO  CC: Kathlene November, MD  Jenkins Rouge, MD

## 2012-08-21 NOTE — Patient Instructions (Addendum)
1.  We will check a carotid doppler 2.  We will perform a hypercoagulable workup.  We will get cholesterol results from [facility] 3.  We will get a 24 hour Holter Monitor 4.  We will check an MRI brain. 5.  Continue taking aspirin 81 mg daily 6.  Continue blood pressure control 7.  Continue low sodium/low fat diet 8.  Light exercise as per PCP 9  I will contact you with any results.  Your MRI is scheduled at [facility] located at [address] in [place] on Tuesday, July 8th at 9:30 am. Please arrive 15 minutes prior to your appointment time.   [phone].  Your doppler study is scheduled at [facility] on Tuesday, July 8th at 11:00. Please arrive at 10:45 am.  Enter the hospital off of [address] at [facility] will call you to schedule your 24 hour Holter Monitor testing.   [id number]

## 2012-08-21 NOTE — Addendum Note (Signed)
Addended byTomi Likens, ADAM R on: 08/21/2012 12:37 PM   Modules accepted: Level of Service

## 2012-08-22 LAB — HYPERCOAGULABLE PANEL, COMPREHENSIVE
Beta-2-Glycoprotein I IgA: 0 A Units (ref <20)
DRVVT 1:1 Mix: 38.4 secs (ref <42.9)
Lupus Anticoagulant: NOT DETECTED
Protein C Activity: 165 % — ABNORMAL HIGH (ref 75–133)
Protein C, Total: 98 % (ref 72–160)
Protein S Activity: 86 % (ref 69–129)

## 2012-08-27 ENCOUNTER — Ambulatory Visit (HOSPITAL_COMMUNITY): Payer: Managed Care, Other (non HMO)

## 2012-08-27 ENCOUNTER — Ambulatory Visit
Admission: RE | Admit: 2012-08-27 | Discharge: 2012-08-27 | Disposition: A | Discharge status: Home / Self Care | Payer: Managed Care, Other (non HMO) | Source: Ambulatory Visit | Attending: Neurology | Admitting: Neurology

## 2012-08-27 ENCOUNTER — Telehealth: Payer: Self-pay | Admitting: Neurology

## 2012-08-27 DIAGNOSIS — G459 Transient cerebral ischemic attack, unspecified: Secondary | ICD-10-CM

## 2012-08-27 NOTE — Telephone Encounter (Signed)
Chad Major, DO   Sent: Tue August 27, 2012  4:59 PM   To: Angelica Pou, RN  Message    Please let Mr. Chalifoux know that blood work looks okay and MRI is normal.   Called and left the patient a vm message on his home phone stating labs and MRI all normal.

## 2012-08-30 ENCOUNTER — Ambulatory Visit (HOSPITAL_COMMUNITY)
Admission: RE | Admit: 2012-08-30 | Discharge: 2012-08-30 | Disposition: A | Discharge status: Home / Self Care | Payer: Managed Care, Other (non HMO) | Source: Ambulatory Visit | Attending: Neurology | Admitting: Neurology

## 2012-08-30 DIAGNOSIS — G459 Transient cerebral ischemic attack, unspecified: Secondary | ICD-10-CM

## 2012-08-30 DIAGNOSIS — R209 Unspecified disturbances of skin sensation: Secondary | ICD-10-CM | POA: Insufficient documentation

## 2012-08-30 DIAGNOSIS — R29810 Facial weakness: Secondary | ICD-10-CM | POA: Insufficient documentation

## 2012-08-30 NOTE — Progress Notes (Signed)
*  PRELIMINARY RESULTS* Vascular Ultrasound Carotid Duplex (Doppler) has been completed.   There is no obvious evidence of hemodynamically significant internal carotid artery stenosis >40%. Vertebral arteries are patent with antegrade flow.  08/30/2012 9:24 AM Maudry Mayhew, RVT, RDCS, RDMS

## 2012-09-02 ENCOUNTER — Telehealth: Payer: Self-pay | Admitting: Neurology

## 2012-09-02 NOTE — Telephone Encounter (Signed)
Spoke with the patient. Information given as per Dr. Tomi Likens below. No questions or concerns voiced at this time.

## 2012-09-02 NOTE — Telephone Encounter (Signed)
Message copied by Angelica Pou on Mon Sep 02, 2012 10:50 AM ------      Message from: JAFFE, ADAM R      Created: Mon Sep 02, 2012  7:19 AM       Please let Mr. Huitron know that carotid dopplers look okay. ------

## 2012-09-05 NOTE — Addendum Note (Signed)
Addended by: Angelica Pou on: 09/05/2012 01:39 PM   Modules accepted: Orders

## 2012-09-08 ENCOUNTER — Telehealth: Payer: Self-pay | Admitting: Internal Medicine

## 2012-09-08 NOTE — Telephone Encounter (Signed)
Please arrange a followup reference: hypertension and decreased renal function.

## 2012-09-13 ENCOUNTER — Ambulatory Visit (INDEPENDENT_AMBULATORY_CARE_PROVIDER_SITE_OTHER): Payer: Managed Care, Other (non HMO)

## 2012-09-13 DIAGNOSIS — R002 Palpitations: Secondary | ICD-10-CM

## 2012-09-13 NOTE — Progress Notes (Signed)
Placed a 24 hrs  E-cardio monitor

## 2012-09-18 ENCOUNTER — Ambulatory Visit: Payer: Managed Care, Other (non HMO) | Admitting: Internal Medicine

## 2012-09-18 DIAGNOSIS — Z0289 Encounter for other administrative examinations: Secondary | ICD-10-CM

## 2012-09-18 NOTE — Telephone Encounter (Signed)
Send a letter, "please reschedule"

## 2012-09-18 NOTE — Telephone Encounter (Signed)
Pt had appt already scheduled for 8:00 this morning, 09/18/12, but did not come.

## 2012-09-20 ENCOUNTER — Ambulatory Visit (INDEPENDENT_AMBULATORY_CARE_PROVIDER_SITE_OTHER): Payer: Managed Care, Other (non HMO) | Admitting: Internal Medicine

## 2012-09-20 ENCOUNTER — Encounter: Payer: Self-pay | Admitting: Internal Medicine

## 2012-09-20 VITALS — BP 140/98 | HR 65 | Temp 98.3°F | Wt 283.6 lb

## 2012-09-20 DIAGNOSIS — N189 Chronic kidney disease, unspecified: Secondary | ICD-10-CM

## 2012-09-20 DIAGNOSIS — I10 Essential (primary) hypertension: Secondary | ICD-10-CM

## 2012-09-20 DIAGNOSIS — R9431 Abnormal electrocardiogram [ECG] [EKG]: Secondary | ICD-10-CM

## 2012-09-20 DIAGNOSIS — G459 Transient cerebral ischemic attack, unspecified: Secondary | ICD-10-CM

## 2012-09-20 LAB — BASIC METABOLIC PANEL
CO2: 28 mEq/L (ref 19–32)
Calcium: 9.3 mg/dL (ref 8.4–10.5)
GFR: 43.47 mL/min — ABNORMAL LOW (ref >60.00)
Sodium: 138 mEq/L (ref 135–145)

## 2012-09-20 MED ORDER — CARVEDILOL 25 MG PO TABS: 25.0000 mg | ORAL_TABLET | Freq: Two times a day (BID) | ORAL | Status: DC

## 2012-09-20 MED ORDER — AMLODIPINE BESYLATE 10 MG PO TABS: 10.0000 mg | ORAL_TABLET | Freq: Every day | ORAL | Status: DC

## 2012-09-20 NOTE — Progress Notes (Signed)
  Subjective:    Patient ID: Chad Adkins, male    DOB: [birth year], 47 y.o.   MRN: [id number]  HPI Followup Hypertension, good medication compliance, BP today 140 / 98, at home diastolic BP is around 95, does not recall his systolic BP.  Past Medical History  Diagnosis Date  . Hypertension    Past Surgical History  Procedure Laterality Date  . Hernia repair    . Rotator cuff repair Right     Review of Systems Denies chest pain or shortness or breath No lower extremity edema Had symptoms suggestive of TIA, no further symptoms.  I asked about side effects of medications, initially he felt slightly dizzy but that went away quickly.     Objective:   Physical Exam BP 140/98  Pulse 65  Temp(Src) 98.3 F (36.8 C) (Oral)  Wt 283 lb 9.6 oz (128.64 kg)  BMI 35.94 kg/m2  SpO2 98%  General -- alert, well-developed, NAD Lungs -- normal respiratory effort, no intercostal retractions, no accessory muscle use, and normal breath sounds.   Heart-- normal rate, regular rhythm, no murmur, and no gallop.  Extremities-- no pretibial edema bilaterally Neurologic-- alert & oriented X3 and strength normal in all extremities. Psych-- Cognition and judgment appear intact. Alert and cooperative with normal attention span and concentration.  not anxious appearing and not depressed appearing.       Assessment & Plan:

## 2012-09-20 NOTE — Assessment & Plan Note (Signed)
Echo on 07/2012 showed LVH

## 2012-09-20 NOTE — Assessment & Plan Note (Addendum)
Renal u/s 07/2012: No evidence of RAS Needs better control, increase amlodipine from 5 mg to 10 mg with increased from 12.5 mg to 25 mg twice a day . Low-salt diet, exercise Explained to patient importance to keep BP at goal

## 2012-09-20 NOTE — Patient Instructions (Addendum)
Take the medications as prescribed Check the  blood pressure 2 or 3 times a week, be sure it is between 110/60 and 140/80. If it is consistently higher or lower, let me know. Low salt diet! Avoid excessive proteins Gradual return to exercise (cardio more than ;lifting!) Come back in 3 months   Sodium-Controlled Diet Sodium is a mineral. It is found in many foods. Sodium may be found naturally or added during the making of a food. The most common form of sodium is salt, which is made up of sodium and chloride. Reducing your sodium intake involves changing your eating habits. The following guidelines will help you reduce the sodium in your diet:  Stop using the salt shaker.  Use salt sparingly in cooking and baking.  Substitute with sodium-free seasonings and spices.  Do not use a salt substitute (potassium chloride) without your caregiver's permission.  Include a variety of fresh, unprocessed foods in your diet.  Limit the use of processed and convenience foods that are high in sodium. USE THE FOLLOWING FOODS SPARINGLY: Breads/Starches  Commercial bread stuffing, commercial pancake or waffle mixes, coating mixes. Waffles. Croutons. Prepared (boxed or frozen) potato, rice, or noodle mixes that contain salt or sodium. Salted [place] fries or hash browns. Salted popcorn, breads, crackers, chips, or snack foods. Vegetables  Vegetables canned with salt or prepared in cream, butter, or cheese sauces. Sauerkraut. Tomato or vegetable juices canned with salt.  Fresh vegetables are allowed if rinsed thoroughly. Fruit  Fruit is okay to eat. Meat and Meat Substitutes  Salted or smoked meats, such as bacon or Canadian bacon, chipped or corned beef, hot dogs, salt pork, luncheon meats, pastrami, ham, or sausage. Canned or smoked fish, poultry, or meat. Processed cheese or cheese spreads, blue or Roquefort cheese. Battered or frozen fish products. Prepared spaghetti sauce. Baked beans. Reuben  sandwiches. Salted nuts. Caviar. Milk  Limit buttermilk to 1 cup per week. Soups and Combination Foods  Bouillon cubes, canned or dried soups, broth, consomm. Convenience (frozen or packaged) dinners with more than 600 mg sodium. Pot pies, pizza, Asian food, fast food cheeseburgers, and specialty sandwiches. Desserts and Sweets  Regular (salted) desserts, pie, commercial fruit snack pies, commercial snack cakes, canned puddings.  Eat desserts and sweets in moderation. Fats and Oils  Gravy mixes or canned gravy. No more than 1 to 2 tbs of salad dressing. Chip dips.  Eat fats and oils in moderation. Beverages  See those listed under the vegetables and milk groups. Condiments  Ketchup, mustard, meat sauces, salsa, regular (salted) and lite soy sauce or mustard. Dill pickles, olives, meat tenderizer. Prepared horseradish or pickle relish. Dutch-processed cocoa. Baking powder or baking soda used medicinally. Worcestershire sauce. "Light" salt. Salt substitute, unless approved by your caregiver. Document Released: 07/29/2001 Document Revised: 05/01/2011 Document Reviewed: 03/01/2009 [facility] Patient Information 2014 [place], [place].

## 2012-09-20 NOTE — Assessment & Plan Note (Signed)
Last creatinine slightly increased,   HCTZ was d/c, check a BMP. Increased creatinine may be from large muscle mass.

## 2012-09-20 NOTE — Assessment & Plan Note (Signed)
had symptoms suggestive of TIA, now asymptomatic. Saw neurology, workup was ordered, MRI was negative

## 2012-09-26 ENCOUNTER — Telehealth: Payer: Self-pay | Admitting: Internal Medicine

## 2012-09-26 NOTE — Telephone Encounter (Signed)
Discussed with patient, verbalized understanding.

## 2012-09-26 NOTE — Telephone Encounter (Signed)
Patient is calling back requesting his lab results. Please advise.

## 2012-10-23 ENCOUNTER — Ambulatory Visit: Payer: Managed Care, Other (non HMO) | Admitting: Internal Medicine

## 2012-12-23 ENCOUNTER — Ambulatory Visit: Payer: Managed Care, Other (non HMO) | Admitting: Internal Medicine

## 2013-04-14 ENCOUNTER — Other Ambulatory Visit: Payer: Self-pay | Admitting: Internal Medicine

## 2013-04-14 DIAGNOSIS — I10 Essential (primary) hypertension: Secondary | ICD-10-CM

## 2013-04-14 NOTE — Telephone Encounter (Signed)
Refill for norvasc sent to [facility] on [address]

## 2013-06-15 ENCOUNTER — Other Ambulatory Visit: Payer: Self-pay | Admitting: Internal Medicine

## 2013-06-16 NOTE — Telephone Encounter (Signed)
Rx sent to the pharmacy by e-script.  Pt needs office visit.//AB/CMA 

## 2013-08-21 ENCOUNTER — Other Ambulatory Visit: Payer: Self-pay | Admitting: Internal Medicine

## 2014-05-11 ENCOUNTER — Encounter: Payer: Managed Care, Other (non HMO) | Admitting: Internal Medicine

## 2014-07-30 ENCOUNTER — Telehealth: Payer: Self-pay | Admitting: Internal Medicine

## 2014-07-30 NOTE — Telephone Encounter (Signed)
Pre Visit letter sent  °

## 2014-08-17 ENCOUNTER — Telehealth: Payer: Self-pay | Admitting: *Deleted

## 2014-08-17 NOTE — Telephone Encounter (Signed)
Unable to reach patient at time of Pre-Visit Call.  Left message for patient to return call when available.    

## 2014-08-18 ENCOUNTER — Encounter: Payer: Self-pay | Admitting: *Deleted

## 2014-08-18 ENCOUNTER — Encounter: Payer: Self-pay | Admitting: Internal Medicine

## 2014-08-18 ENCOUNTER — Ambulatory Visit (INDEPENDENT_AMBULATORY_CARE_PROVIDER_SITE_OTHER): Payer: 59 | Admitting: Internal Medicine

## 2014-08-18 VITALS — BP 138/92 | HR 75 | Temp 98.1°F | Ht 74.5 in | Wt 263.1 lb

## 2014-08-18 DIAGNOSIS — N182 Chronic kidney disease, stage 2 (mild): Secondary | ICD-10-CM

## 2014-08-18 DIAGNOSIS — Z0000 Encounter for general adult medical examination without abnormal findings: Secondary | ICD-10-CM | POA: Diagnosis not present

## 2014-08-18 DIAGNOSIS — I10 Essential (primary) hypertension: Secondary | ICD-10-CM

## 2014-08-18 MED ORDER — CARVEDILOL 12.5 MG PO TABS: 12.5000 mg | ORAL_TABLET | Freq: Two times a day (BID) | ORAL | Status: DC

## 2014-08-18 MED ORDER — AMLODIPINE BESYLATE 10 MG PO TABS: 10.0000 mg | ORAL_TABLET | Freq: Every day | ORAL | Status: DC

## 2014-08-18 MED ORDER — HYDROCORTISONE 2.5 % EX CREA: TOPICAL_CREAM | Freq: Two times a day (BID) | CUTANEOUS | Status: DC

## 2014-08-18 NOTE — Progress Notes (Signed)
Pre visit review using our clinic review tool, if applicable. No additional management support is needed unless otherwise documented below in the visit note. 

## 2014-08-18 NOTE — Assessment & Plan Note (Signed)
Due for labs, ultrasound 07-2012:  no renal artery stenosis

## 2014-08-18 NOTE — Progress Notes (Signed)
Subjective:    Patient ID: Chad Adkins, male    DOB: [birth year], 49 y.o.   MRN: [id number]  DOS:  08/18/2014 Type of visit - description : Complete physical exam Interval history: Hypertension, ran out of medication more than a year ago, feeling well. Takes aspirin very sporadically   Review of Systems Constitutional: No fever. No chills. No unexplained wt changes. No unusual sweats  HEENT: No dental problems, no ear discharge, no facial swelling, no voice changes. No eye discharge, no eye  redness , no  intolerance to light   Respiratory: No wheezing , no  difficulty breathing. No cough , no mucus production  Cardiovascular: No CP, no leg swelling , no  Palpitations  GI: no nausea, no vomiting, no diarrhea , no  abdominal pain.  No blood in the stools. No dysphagia, no odynophagia    Endocrine: No polyphagia, no polyuria , no polydipsia  GU: No dysuria, gross hematuria, difficulty urinating. No urinary urgency, no frequency.  Musculoskeletal: No joint swellings or unusual aches or pains  Skin: No change in the color of the skin, palor, has a patch of dry itchy skin near the left knee, he constantly rub that area on the door at work  Allergic, immunologic: No environmental allergies , no  food allergies  Neurological: No dizziness no  syncope. No headaches. No diplopia, no slurred, no slurred speech, no motor deficits, no facial  Numbness  Hematological: No enlarged lymph nodes, no easy bruising , no unusual bleedings  Psychiatry: No suicidal ideas, no hallucinations, no beavior problems, no confusion.  No unusual/severe anxiety, no depression    Past Medical History  Diagnosis Date  . Hypertension   . CRF (chronic renal failure) 07/16/2012  . Abnormal ECG 07/16/2012    echo--- LVH    Past Surgical History  Procedure Laterality Date  . Hernia repair    . Rotator cuff repair Right     History   Social History  . Marital Status: Single    Spouse Name: N/A    . Number of Children: 2  . Years of Education: N/A   Occupational History  . Freight forwarder , [employer]     Social History Main Topics  . Smoking status: Current Every Day Smoker    Types: Cigars  . Smokeless tobacco: Never Used     Comment: daily , 4-6 cigars a day  . Alcohol Use: 0.0 oz/week    0 Standard drinks or equivalent per week     Comment: socially   . Drug Use: No  . Sexual Activity: Not on file   Other Topics Concern  . Not on file   Social History Narrative   Lives by himself      Family History  Problem Relation Age of Onset  . CAD Neg Hx   . Diabetes Neg Hx   . Stroke Neg Hx   . Colon cancer Neg Hx   . Prostate cancer Neg Hx   . Hyperlipidemia Father   . Hypertension Mother   . Breast cancer Mother        Medication List       This list is accurate as of: 08/18/14 11:59 PM.  Always use your most recent med list.               amLODipine 10 MG tablet  Commonly known as:  NORVASC  Take 1 tablet (10 mg total) by mouth daily.     aspirin 81 MG chewable  tablet  Chew 81 mg by mouth daily.     carvedilol 12.5 MG tablet  Commonly known as:  COREG  Take 1 tablet (12.5 mg total) by mouth 2 (two) times daily with a meal.     hydrocortisone 2.5 % cream  Apply topically 2 (two) times daily.           Objective:   Physical Exam  Skin:      BP 138/92 mmHg  Pulse 75  Temp(Src) 98.1 F (36.7 C) (Oral)  Ht 6' 2.5" (1.892 m)  Wt 263 lb 2 oz (119.353 kg)  BMI 33.34 kg/m2  SpO2 99% General:   Well developed, well nourished . NAD.  Neck:  Full range of motion. Supple. No  Thyromegaly  HEENT:  Normocephalic . Face symmetric, atraumatic Lungs:  CTA B Normal respiratory effort, no intercostal retractions, no accessory muscle use. Heart: RRR,  no murmur.  No pretibial edema bilaterally  Abdomen:  Not distended, soft, non-tender. No rebound or rigidity. No mass,organomegaly Skin: Exposed areas without rash. Not pale. Not  jaundice Neurologic:  alert & oriented X3.  Speech normal, gait appropriate for age and unassisted Strength symmetric and appropriate for age.  Psych: Cognition and judgment appear intact.  Cooperative with normal attention span and concentration.  Behavior appropriate. No anxious or depressed appearing.        Assessment & Plan:   Rash, likely to due to the constant rubbing against a door, recommend avoidance, hydrocortisone as needed

## 2014-08-18 NOTE — Assessment & Plan Note (Signed)
Td declined Never had a colonoscopy Reports she is doing well with diet and exercise He smokes 5 or 6 cigars daily, risk of cancer among others discussed, strongly encourage to see the dentist at least every 6 months and also discuss cessation of tobacco Labs

## 2014-08-18 NOTE — Patient Instructions (Signed)
Please schedule labs to be done within few days (fasting)   Taking medications as prescribed  Check the  blood pressure 2 or 3 times a month   Be sure your blood pressure is between 110/65 and  145/85.  if it is consistently higher or lower, let me know    Smoking Cessation Quitting smoking is important to your health and has many advantages. However, it is not always easy to quit since nicotine is a very addictive drug. Oftentimes, people try 3 times or more before being able to quit. This document explains the best ways for you to prepare to quit smoking. Quitting takes hard work and a lot of effort, but you can do it. ADVANTAGES OF QUITTING SMOKING  You will live longer, feel better, and live better.  Your body will feel the impact of quitting smoking almost immediately.  Within 20 minutes, blood pressure decreases. Your pulse returns to its normal level.  After 8 hours, carbon monoxide levels in the blood return to normal. Your oxygen level increases.  After 24 hours, the chance of having a heart attack starts to decrease. Your breath, hair, and body stop smelling like smoke.  After 48 hours, damaged nerve endings begin to recover. Your sense of taste and smell improve.  After 72 hours, the body is virtually free of nicotine. Your bronchial tubes relax and breathing becomes easier.  After 2 to 12 weeks, lungs can hold more air. Exercise becomes easier and circulation improves.  The risk of having a heart attack, stroke, cancer, or lung disease is greatly reduced.  After 1 year, the risk of coronary heart disease is cut in half.  After 5 years, the risk of stroke falls to the same as a nonsmoker.  After 10 years, the risk of lung cancer is cut in half and the risk of other cancers decreases significantly.  After 15 years, the risk of coronary heart disease drops, usually to the level of a nonsmoker.  If you are pregnant, quitting smoking will improve your chances of  having a healthy baby.  The people you live with, especially any children, will be healthier.  You will have extra money to spend on things other than cigarettes. QUESTIONS TO THINK ABOUT BEFORE ATTEMPTING TO QUIT You may want to talk about your answers with your health care provider.  Why do you want to quit?  If you tried to quit in the past, what helped and what did not?  What will be the most difficult situations for you after you quit? How will you plan to handle them?  Who can help you through the tough times? Your family? Friends? A health care provider?  What pleasures do you get from smoking? What ways can you still get pleasure if you quit? Here are some questions to ask your health care provider:  How can you help me to be successful at quitting?  What medicine do you think would be best for me and how should I take it?  What should I do if I need more help?  What is smoking withdrawal like? How can I get information on withdrawal? GET READY  Set a quit date.  Change your environment by getting rid of all cigarettes, ashtrays, matches, and lighters in your home, car, or work. Do not let people smoke in your home.  Review your past attempts to quit. Think about what worked and what did not. GET SUPPORT AND ENCOURAGEMENT You have a better chance of being  successful if you have help. You can get support in many ways.  Tell your family, friends, and coworkers that you are going to quit and need their support. Ask them not to smoke around you.  Get individual, group, or telephone counseling and support. Programs are available at [employer] and health centers. Call your local health department for information about programs in your area.  Spiritual beliefs and practices may help some smokers quit.  Download a "quit meter" on your computer to keep track of quit statistics, such as how long you have gone without smoking, cigarettes not smoked, and money saved.  Get  a self-help book about quitting smoking and staying off tobacco. [place] yourself from urges to smoke. Talk to someone, go for a walk, or occupy your time with a task.  Change your normal routine. Take a different route to work. Drink tea instead of coffee. Eat breakfast in a different place.  Reduce your stress. Take a hot bath, exercise, or read a book.  Plan something enjoyable to do every day. Reward yourself for not smoking.  Explore interactive web-based programs that specialize in helping you quit. GET MEDICINE AND USE IT CORRECTLY Medicines can help you stop smoking and decrease the urge to smoke. Combining medicine with the above behavioral methods and support can greatly increase your chances of successfully quitting smoking.  Nicotine replacement therapy helps deliver nicotine to your body without the negative effects and risks of smoking. Nicotine replacement therapy includes nicotine gum, lozenges, inhalers, nasal sprays, and skin patches. Some may be available over-the-counter and others require a prescription.  Antidepressant medicine helps people abstain from smoking, but how this works is unknown. This medicine is available by prescription.  Nicotinic receptor partial agonist medicine simulates the effect of nicotine in your brain. This medicine is available by prescription. Ask your health care provider for advice about which medicines to use and how to use them based on your health history. Your health care provider will tell you what side effects to look out for if you choose to be on a medicine or therapy. Carefully read the information on the package. Do not use any other product containing nicotine while using a nicotine replacement product.  RELAPSE OR DIFFICULT SITUATIONS Most relapses occur within the first 3 months after quitting. Do not be discouraged if you start smoking again. Remember, most people try several times before finally  quitting. You may have symptoms of withdrawal because your body is used to nicotine. You may crave cigarettes, be irritable, feel very hungry, cough often, get headaches, or have difficulty concentrating. The withdrawal symptoms are only temporary. They are strongest when you first quit, but they will go away within 10-14 days. To reduce the chances of relapse, try to:  Avoid drinking alcohol. Drinking lowers your chances of successfully quitting.  Reduce the amount of caffeine you consume. Once you quit smoking, the amount of caffeine in your body increases and can give you symptoms, such as a rapid heartbeat, sweating, and anxiety.  Avoid smokers because they can make you want to smoke.  Do not let weight gain distract you. Many smokers will gain weight when they quit, usually less than 10 pounds. Eat a healthy diet and stay active. You can always lose the weight gained after you quit.  Find ways to improve your mood other than smoking. FOR MORE INFORMATION  www.smokefree.gov  Document Released: 01/31/2001 Document Revised: 06/23/2013 Document Reviewed: 05/18/2011 [employer] Patient  Information 2015 [employer]. This information is not intended to replace advice given to you by your health care provider. Make sure you discuss any questions you have with your health care provider.  

## 2014-08-18 NOTE — Assessment & Plan Note (Addendum)
History of hypertension, mild renal insufficiency and LVH by echo, not on medication for more than a year, risks of hypertension -even if asymptomatic- discussed including CAD, strokes, further kidney damage, etc. Recommend good compliance of medication, in the past he felt slightly tired with meds. Plan:  Refill amlodipine 10 mg Used to take carvedilol from 25 mg bid but will decrease to 12.5 mg twice a day as he has a history of feeling tired.   BP monitoring See instructions

## 2014-08-18 NOTE — Addendum Note (Signed)
Addended by: Leticia Penna A on: 08/18/2014 11:50 AM   Modules accepted: Medications

## 2014-08-18 NOTE — Telephone Encounter (Signed)
Pre-Visit Call completed with patient and chart updated.   Pre-Visit Info documented in Specialty Comments under [employer].    

## 2014-08-19 ENCOUNTER — Other Ambulatory Visit (INDEPENDENT_AMBULATORY_CARE_PROVIDER_SITE_OTHER): Payer: 59

## 2014-08-19 DIAGNOSIS — Z0000 Encounter for general adult medical examination without abnormal findings: Secondary | ICD-10-CM | POA: Diagnosis not present

## 2014-08-19 LAB — CBC WITH DIFFERENTIAL/PLATELET
BASOS PCT: 0.6 % (ref 0.0–3.0)
Basophils Absolute: 0 10*3/uL (ref 0.0–0.1)
EOS ABS: 0.2 10*3/uL (ref 0.0–0.7)
EOS PCT: 3.7 % (ref 0.0–5.0)
HEMATOCRIT: 45 % (ref 39.0–52.0)
Hemoglobin: 14.7 g/dL (ref 13.0–17.0)
LYMPHS ABS: 2 10*3/uL (ref 0.7–4.0)
Lymphocytes Relative: 33.5 % (ref 12.0–46.0)
MCHC: 32.6 g/dL (ref 30.0–36.0)
MCV: 81 fl (ref 78.0–100.0)
Monocytes Absolute: 0.6 10*3/uL (ref 0.1–1.0)
Monocytes Relative: 9.7 % (ref 3.0–12.0)
NEUTROS ABS: 3.2 10*3/uL (ref 1.4–7.7)
Neutrophils Relative %: 52.5 % (ref 43.0–77.0)
PLATELETS: 255 10*3/uL (ref 150.0–400.0)
RBC: 5.55 Mil/uL (ref 4.22–5.81)
RDW: 16 % — ABNORMAL HIGH (ref 11.5–15.5)
WBC: 6 10*3/uL (ref 4.0–10.5)

## 2014-08-19 LAB — COMPREHENSIVE METABOLIC PANEL
ALBUMIN: 3.6 g/dL (ref 3.5–5.2)
ALT: 17 U/L (ref 0–53)
AST: 21 U/L (ref 0–37)
Alkaline Phosphatase: 59 U/L (ref 39–117)
BUN: 18 mg/dL (ref 6–23)
CO2: 31 mEq/L (ref 19–32)
Calcium: 9.2 mg/dL (ref 8.4–10.5)
Chloride: 103 mEq/L (ref 96–112)
Creatinine, Ser: 2.28 mg/dL — ABNORMAL HIGH (ref 0.40–1.50)
GFR: 32.81 mL/min — ABNORMAL LOW (ref >60.00)
GLUCOSE: 86 mg/dL (ref 70–99)
POTASSIUM: 4.1 meq/L (ref 3.5–5.1)
SODIUM: 139 meq/L (ref 135–145)
Total Bilirubin: 0.5 mg/dL (ref 0.2–1.2)
Total Protein: 7.3 g/dL (ref 6.0–8.3)

## 2014-08-19 LAB — LIPID PANEL
CHOLESTEROL: 307 mg/dL — AB (ref 0–200)
HDL: 93.3 mg/dL (ref >39.00)
LDL Cholesterol: 201 mg/dL — ABNORMAL HIGH (ref 0–99)
NonHDL: 213.7
Total CHOL/HDL Ratio: 3
Triglycerides: 65 mg/dL (ref 0.0–149.0)
VLDL: 13 mg/dL (ref 0.0–40.0)

## 2014-08-19 LAB — TSH: TSH: 0.9 u[IU]/mL (ref 0.35–4.50)

## 2014-08-19 LAB — HIV ANTIBODY (ROUTINE TESTING W REFLEX): HIV: NONREACTIVE

## 2014-08-20 MED ORDER — ATORVASTATIN CALCIUM 20 MG PO TABS: 20.0000 mg | ORAL_TABLET | Freq: Every day | ORAL | Status: DC

## 2014-08-20 NOTE — Addendum Note (Signed)
Addended by: Wilfrid Lund on: 08/20/2014 02:02 PM   Modules accepted: Orders

## 2014-09-28 ENCOUNTER — Encounter: Payer: Self-pay | Admitting: Internal Medicine

## 2014-10-12 ENCOUNTER — Ambulatory Visit (INDEPENDENT_AMBULATORY_CARE_PROVIDER_SITE_OTHER): Payer: 59 | Admitting: Internal Medicine

## 2014-10-12 ENCOUNTER — Encounter: Payer: Self-pay | Admitting: Internal Medicine

## 2014-10-12 ENCOUNTER — Telehealth: Payer: Self-pay | Admitting: Internal Medicine

## 2014-10-12 VITALS — BP 138/98 | HR 69 | Temp 97.9°F | Resp 18 | Ht 74.5 in | Wt 264.0 lb

## 2014-10-12 DIAGNOSIS — I10 Essential (primary) hypertension: Secondary | ICD-10-CM

## 2014-10-12 DIAGNOSIS — N182 Chronic kidney disease, stage 2 (mild): Secondary | ICD-10-CM | POA: Diagnosis not present

## 2014-10-12 DIAGNOSIS — E785 Hyperlipidemia, unspecified: Secondary | ICD-10-CM | POA: Diagnosis not present

## 2014-10-12 DIAGNOSIS — R0683 Snoring: Secondary | ICD-10-CM

## 2014-10-12 MED ORDER — CARVEDILOL 25 MG PO TABS: 25.0000 mg | ORAL_TABLET | Freq: Two times a day (BID) | ORAL | Status: DC

## 2014-10-12 MED ORDER — HYDROCORTISONE 2.5 % EX CREA: TOPICAL_CREAM | Freq: Two times a day (BID) | CUTANEOUS | Status: DC

## 2014-10-12 NOTE — Telephone Encounter (Signed)
Caller name: Janeil Relation to pt: self Call back number: [phone] [facility]acy: [id number] [place], [place],  [address]  Tel [phone]  Reason for call: Pt called stating was seen today and forgot to mention that needed to change [facility] from [facility] since at [facility] they do not accept [facility] health [facility] anymore. Pt is wanting to know if he has to start new medication right away or does he continue with the old meds. Please advise.

## 2014-10-12 NOTE — Patient Instructions (Signed)
Get your blood work before you leave   Increase carvedilol to 25 mg one tablet twice a day  Watch for side effects, let me know if problems don't stop your medication   Check the  blood pressure 2 or 3 times a  Week, at home or at the store. Consider getting a new machine. Fax me or call the office with your readings in  3 weeks Be sure your blood pressure is between 110/65 and  145/85.  if it is consistently higher or lower, let me know

## 2014-10-12 NOTE — Telephone Encounter (Signed)
Spoke with pt. He now states to leave Rxs at [facility] for now as they gave him a discount on both Rxs. States he will need future Rxs to go to [facility] as below. Pharmacy list updated. He will double up on current carvedilol (12.5mg  and take 2 twice a day to equal new dose) until current supply is completed.

## 2014-10-12 NOTE — Progress Notes (Signed)
Pre visit review using our clinic review tool, if applicable. No additional management support is needed unless otherwise documented below in the visit note. 

## 2014-10-12 NOTE — Assessment & Plan Note (Signed)
Last creatinine elevated from previous baseline, now he restarted blood pressure medication, recheck a BMP

## 2014-10-12 NOTE — Assessment & Plan Note (Signed)
Based on the last cholesterol panel, started Lipitor, good compliance, no apparent side effects, check labs

## 2014-10-12 NOTE — Progress Notes (Signed)
Subjective:    Patient ID: Chad Adkins., male    DOB: November 02, 1965, 49 y.o.   MRN: [id number]  DOS:  10/12/2014 Type of visit - description : rov Interval history:  Hypertension: Good compliance w/ medication, he checks his blood pressure sometimes at home and reports is high but he could not give me any readings and thinks his cuff is too tight.. Chronic renal failure, last creatinine elevated. High cholesterol: Started statins base on the last cholesterol panel , no apparent side effects  Review of Systems Denies chest pain or difficulty breathing Occasionally feels slightly dizzy when he stands up since he started the medications for blood pressure. When asked, admits to chronic, severe snoring. Occasional feels sleepy.  Past Medical History  Diagnosis Date  . Hypertension   . CRF (chronic renal failure) 07/16/2012  . Abnormal ECG 07/16/2012    echo--- LVH    Past Surgical History  Procedure Laterality Date  . Hernia repair    . Rotator cuff repair Right     Social History   Social History  . Marital Status: Single    Spouse Name: N/A  . Number of Children: 2  . Years of Education: N/A   Occupational History  . Freight forwarder , [employer] store     Social History Main Topics  . Smoking status: Current Every Day Smoker    Types: Cigars  . Smokeless tobacco: Never Used     Comment: daily , 4-6 cigars a day  . Alcohol Use: 0.0 oz/week    0 Standard drinks or equivalent per week     Comment: socially   . Drug Use: No  . Sexual Activity: Not on file   Other Topics Concern  . Not on file   Social History Narrative   Lives by himself         Medication List       This list is accurate as of: 10/12/14 11:59 PM.  Always use your most recent med list.               amLODipine 10 MG tablet  Commonly known as:  NORVASC  Take 1 tablet (10 mg total) by mouth daily.     aspirin 81 MG chewable tablet  Chew 81 mg by mouth daily.     atorvastatin 20 MG tablet    Commonly known as:  LIPITOR  Take 1 tablet (20 mg total) by mouth at bedtime.     carvedilol 25 MG tablet  Commonly known as:  COREG  Take 1 tablet (25 mg total) by mouth 2 (two) times daily with a meal.     hydrocortisone 2.5 % cream  Apply topically 2 (two) times daily.           Objective:   Physical Exam BP 138/98 mmHg  Pulse 69  Temp(Src) 97.9 F (36.6 C) (Oral)  Resp 18  Ht 6' 2.5" (1.892 m)  Wt 264 lb (119.75 kg)  BMI 33.45 kg/m2  SpO2 100% General:   Well developed, well nourished . NAD.  HEENT:  Normocephalic . Face symmetric, atraumatic Lungs:  CTA B Normal respiratory effort, no intercostal retractions, no accessory muscle use. Heart: RRR,  no murmur.  No pretibial edema bilaterally  Skin: Not pale. Not jaundice Neurologic:  alert & oriented X3.  Speech normal, gait appropriate for age and unassisted Psych--  Cognition and judgment appear intact.  Cooperative with normal attention span and concentration.  Behavior appropriate. No anxious or  depressed appearing.      Assessment & Plan:

## 2014-10-12 NOTE — Assessment & Plan Note (Signed)
Currently on carvedilol 12.5 twice a day, amlodipine 10 mg. BP today is about the same as before 138/98. Plan: Increase carvedilol 25 mg twice a day, watch for side effects. He has chronic renal insufficiency, we are checking a BMP today.

## 2014-10-13 DIAGNOSIS — R0683 Snoring: Secondary | ICD-10-CM | POA: Insufficient documentation

## 2014-10-13 LAB — LIPID PANEL
Cholesterol: 217 mg/dL — ABNORMAL HIGH (ref 0–200)
HDL: 85.9 mg/dL (ref >39.00)
LDL CALC: 123 mg/dL — AB (ref 0–99)
NonHDL: 131.59
TRIGLYCERIDES: 43 mg/dL (ref 0.0–149.0)
Total CHOL/HDL Ratio: 3
VLDL: 8.6 mg/dL (ref 0.0–40.0)

## 2014-10-13 LAB — COMPREHENSIVE METABOLIC PANEL
ALBUMIN: 3.8 g/dL (ref 3.5–5.2)
ALK PHOS: 57 U/L (ref 39–117)
ALT: 17 U/L (ref 0–53)
AST: 17 U/L (ref 0–37)
BILIRUBIN TOTAL: 0.3 mg/dL (ref 0.2–1.2)
BUN: 22 mg/dL (ref 6–23)
CALCIUM: 9.1 mg/dL (ref 8.4–10.5)
CHLORIDE: 104 meq/L (ref 96–112)
CO2: 30 mEq/L (ref 19–32)
CREATININE: 2.23 mg/dL — AB (ref 0.40–1.50)
GFR: 40.71 mL/min — ABNORMAL LOW (ref >60.00)
Glucose, Bld: 89 mg/dL (ref 70–99)
Potassium: 4.1 mEq/L (ref 3.5–5.1)
SODIUM: 138 meq/L (ref 135–145)
TOTAL PROTEIN: 7.3 g/dL (ref 6.0–8.3)

## 2014-10-13 NOTE — Assessment & Plan Note (Signed)
Reports heavy snoring for many years, never evaluated for sleep apnea. epworth sleepiness scale score #6, negative, reassess his symptoms periodically to be sure he is not developing sleep apnea

## 2014-10-19 ENCOUNTER — Telehealth: Payer: Self-pay | Admitting: Internal Medicine

## 2014-10-19 NOTE — Telephone Encounter (Signed)
Caller name: Burnice Dornbush   Relationship to patient: Self  Can be reached: [phone] Pharmacy:  Reason for call: pt called in requesting a refill on 2 medications.  1. Lipitor 2. Norvasc  Pt is [facility] [address] [facility]. He says that one that he currently goes to no longer accepts [facility]. Pt is going out of town tomorrow and would like to have it before leaving.

## 2014-10-19 NOTE — Telephone Encounter (Signed)
There is not a [facility] on [address]. [facility] is [facility] on [address] at [address]. Please clarify which [facility]. Thank you.

## 2014-10-21 ENCOUNTER — Other Ambulatory Visit: Payer: Self-pay

## 2014-10-21 MED ORDER — ATORVASTATIN CALCIUM 20 MG PO TABS: 20.0000 mg | ORAL_TABLET | Freq: Every day | ORAL | Status: DC

## 2015-02-01 ENCOUNTER — Ambulatory Visit: Payer: 59 | Admitting: Internal Medicine

## 2015-02-18 ENCOUNTER — Ambulatory Visit: Payer: 59 | Admitting: Internal Medicine

## 2015-02-23 ENCOUNTER — Encounter: Payer: Self-pay | Admitting: Internal Medicine

## 2015-02-23 ENCOUNTER — Ambulatory Visit (INDEPENDENT_AMBULATORY_CARE_PROVIDER_SITE_OTHER): Payer: BLUE CROSS/BLUE SHIELD | Admitting: Internal Medicine

## 2015-02-23 VITALS — BP 128/76 | HR 70 | Temp 98.0°F | Ht 75.0 in | Wt 270.5 lb

## 2015-02-23 DIAGNOSIS — I10 Essential (primary) hypertension: Secondary | ICD-10-CM

## 2015-02-23 DIAGNOSIS — E785 Hyperlipidemia, unspecified: Secondary | ICD-10-CM

## 2015-02-23 DIAGNOSIS — N182 Chronic kidney disease, stage 2 (mild): Secondary | ICD-10-CM | POA: Diagnosis not present

## 2015-02-23 MED ORDER — AMLODIPINE BESYLATE 10 MG PO TABS: 10.0000 mg | ORAL_TABLET | Freq: Every day | ORAL | Status: DC

## 2015-02-23 MED ORDER — CARVEDILOL 25 MG PO TABS: 25.0000 mg | ORAL_TABLET | Freq: Two times a day (BID) | ORAL | Status: DC

## 2015-02-23 MED ORDER — ATORVASTATIN CALCIUM 20 MG PO TABS: 20.0000 mg | ORAL_TABLET | Freq: Every day | ORAL | Status: DC

## 2015-02-23 NOTE — Patient Instructions (Signed)
BEFORE YOU LEAVE THE OFFICE: GO TO THE FRONT DESK  Schedule a complete physical exam to be done in 6 months Please be fasting    AFTER YOU LEAVE THE OFFICE:  Check the  blood pressure 2 or 3 times a week Be sure your blood pressure is between 110/65 and  145/85. If it is consistently higher or lower, let me know

## 2015-02-23 NOTE — Progress Notes (Signed)
Subjective:    Patient ID: Chad Adkins., male    DOB: 1965/12/25, 50 y.o.   MRN: [id number]  DOS:  02/23/2015 Type of visit - description : Routine visit Interval history: Here for chronic medical problems management. He ran out of medications > 3 months ago due to lack of insurance. He is feeling well, no recent ambulatory BPs, diet has been  excellent.   Review of Systems  Denies chest pain or difficulty breathing No nausea, vomiting, diarrhea. No headache  Past Medical History  Diagnosis Date  . Hypertension   . CRF (chronic renal failure) 07/16/2012  . Abnormal ECG 07/16/2012    echo--- LVH    Past Surgical History  Procedure Laterality Date  . Hernia repair    . Rotator cuff repair Right     Social History   Social History  . Marital Status: Single    Spouse Name: N/A  . Number of Children: 2  . Years of Education: N/A   Occupational History  . Freight forwarder , [employer] store     Social History Main Topics  . Smoking status: Current Every Day Smoker    Types: Cigars  . Smokeless tobacco: Never Used     Comment: daily , 4-6 cigars a day  . Alcohol Use: 0.0 oz/week    0 Standard drinks or equivalent per week     Comment: socially   . Drug Use: No  . Sexual Activity: Not on file   Other Topics Concern  . Not on file   Social History Narrative   Lives by himself         Medication List       This list is accurate as of: 02/23/15  5:54 PM.  Always use your most recent med list.               amLODipine 10 MG tablet  Commonly known as:  NORVASC  Take 1 tablet (10 mg total) by mouth daily.     aspirin 81 MG chewable tablet  Chew 81 mg by mouth daily. Reported on 02/23/2015     atorvastatin 20 MG tablet  Commonly known as:  LIPITOR  Take 1 tablet (20 mg total) by mouth at bedtime.     carvedilol 25 MG tablet  Commonly known as:  COREG  Take 1 tablet (25 mg total) by mouth 2 (two) times daily with a meal.     hydrocortisone 2.5 % cream    Apply topically 2 (two) times daily.           Objective:   Physical Exam BP 128/76 mmHg  Pulse 70  Temp(Src) 98 F (36.7 C) (Oral)  Ht 6\' 3"  (1.905 m)  Wt 270 lb 8 oz (122.698 kg)  BMI 33.81 kg/m2  SpO2 96% General:   Well developed, well nourished . NAD.  HEENT:  Normocephalic . Face symmetric, atraumatic Lungs:  CTA B Normal respiratory effort, no intercostal retractions, no accessory muscle use. Heart: RRR,  no murmur.  No pretibial edema bilaterally  Skin: Not pale. Not jaundice Neurologic:  alert & oriented X3.  Speech normal, gait appropriate for age and unassisted Psych--  Cognition and judgment appear intact.  Cooperative with normal attention span and concentration.  Behavior appropriate. No anxious or depressed appearing.      Assessment & Plan:   Assessment: HTN Hyperlipidemia Chronic renal failure. Renal US 07/2012 no RAS, h/o increase creat w/ HCTZ 2014 LVH: Abnormal EKG --->  echo  07/2012: LVH Snoring: Epworth (-) 09-2014  PLAN: HTN: BP well-controlled even without taking medications. Refill amlodipine and carvedilol. Monitor BPs, see instructions. Chronic renal failure: Stable, recommend patient to avoid NSAIDs, drink plenty of fluids and get BP well-controlled High cholesterol: Refill Lipitor Declined a flu shot RTC 6 months

## 2015-02-23 NOTE — Progress Notes (Signed)
Pre visit review using our clinic review tool, if applicable. No additional management support is needed unless otherwise documented below in the visit note. 

## 2015-08-25 ENCOUNTER — Encounter: Payer: BLUE CROSS/BLUE SHIELD | Admitting: Internal Medicine

## 2015-11-24 ENCOUNTER — Other Ambulatory Visit: Payer: Self-pay | Admitting: Internal Medicine

## 2015-11-24 DIAGNOSIS — I10 Essential (primary) hypertension: Secondary | ICD-10-CM

## 2015-12-27 ENCOUNTER — Other Ambulatory Visit: Payer: Self-pay | Admitting: Internal Medicine

## 2016-01-26 ENCOUNTER — Other Ambulatory Visit: Payer: Self-pay | Admitting: Internal Medicine

## 2016-02-21 DIAGNOSIS — N185 Chronic kidney disease, stage 5: Secondary | ICD-10-CM

## 2016-02-21 DIAGNOSIS — N179 Acute kidney failure, unspecified: Secondary | ICD-10-CM

## 2016-02-21 HISTORY — DX: Acute kidney failure, unspecified: N17.9

## 2016-02-21 HISTORY — DX: Chronic kidney disease, stage 5: N18.5

## 2016-03-30 ENCOUNTER — Other Ambulatory Visit: Payer: Self-pay

## 2016-03-30 DIAGNOSIS — I10 Essential (primary) hypertension: Secondary | ICD-10-CM

## 2016-03-30 MED ORDER — CARVEDILOL 25 MG PO TABS: 25.0000 mg | ORAL_TABLET | Freq: Two times a day (BID) | ORAL | 0 refills | Status: DC

## 2016-03-30 MED ORDER — AMLODIPINE BESYLATE 10 MG PO TABS: 10.0000 mg | ORAL_TABLET | Freq: Every day | ORAL | 0 refills | Status: DC

## 2016-06-12 ENCOUNTER — Encounter: Payer: Self-pay | Admitting: Internal Medicine

## 2016-06-12 ENCOUNTER — Telehealth: Payer: Self-pay

## 2016-06-12 ENCOUNTER — Ambulatory Visit (INDEPENDENT_AMBULATORY_CARE_PROVIDER_SITE_OTHER): Payer: BLUE CROSS/BLUE SHIELD | Admitting: Internal Medicine

## 2016-06-12 ENCOUNTER — Other Ambulatory Visit: Payer: Self-pay

## 2016-06-12 VITALS — BP 132/80 | HR 82 | Temp 98.6°F | Resp 14 | Ht 75.0 in | Wt 255.4 lb

## 2016-06-12 DIAGNOSIS — Z0000 Encounter for general adult medical examination without abnormal findings: Secondary | ICD-10-CM

## 2016-06-12 DIAGNOSIS — I10 Essential (primary) hypertension: Secondary | ICD-10-CM

## 2016-06-12 DIAGNOSIS — R7989 Other specified abnormal findings of blood chemistry: Secondary | ICD-10-CM

## 2016-06-12 DIAGNOSIS — Z09 Encounter for follow-up examination after completed treatment for conditions other than malignant neoplasm: Secondary | ICD-10-CM | POA: Insufficient documentation

## 2016-06-12 LAB — CBC WITH DIFFERENTIAL/PLATELET
BASOS ABS: 0 10*3/uL (ref 0.0–0.1)
Basophils Relative: 0.8 % (ref 0.0–3.0)
EOS PCT: 4.1 % (ref 0.0–5.0)
Eosinophils Absolute: 0.2 10*3/uL (ref 0.0–0.7)
HEMATOCRIT: 33.5 % — AB (ref 39.0–52.0)
Hemoglobin: 10.8 g/dL — ABNORMAL LOW (ref 13.0–17.0)
LYMPHS ABS: 1.3 10*3/uL (ref 0.7–4.0)
Lymphocytes Relative: 25.7 % (ref 12.0–46.0)
MCHC: 32.3 g/dL (ref 30.0–36.0)
MCV: 83.2 fl (ref 78.0–100.0)
MONOS PCT: 11.6 % (ref 3.0–12.0)
Monocytes Absolute: 0.6 10*3/uL (ref 0.1–1.0)
NEUTROS ABS: 2.9 10*3/uL (ref 1.4–7.7)
Neutrophils Relative %: 57.8 % (ref 43.0–77.0)
Platelets: 240 10*3/uL (ref 150.0–400.0)
RBC: 4.03 Mil/uL — AB (ref 4.22–5.81)
RDW: 14.8 % (ref 11.5–15.5)
WBC: 5.1 10*3/uL (ref 4.0–10.5)

## 2016-06-12 LAB — FOLATE

## 2016-06-12 LAB — COMPREHENSIVE METABOLIC PANEL
ALBUMIN: 3.3 g/dL — AB (ref 3.5–5.2)
ALK PHOS: 50 U/L (ref 39–117)
ALT: 17 U/L (ref 0–53)
AST: 16 U/L (ref 0–37)
BILIRUBIN TOTAL: 0.3 mg/dL (ref 0.2–1.2)
BUN: 56 mg/dL — ABNORMAL HIGH (ref 6–23)
CALCIUM: 9.1 mg/dL (ref 8.4–10.5)
CO2: 26 mEq/L (ref 19–32)
CREATININE: 7.91 mg/dL — AB (ref 0.40–1.50)
Chloride: 106 mEq/L (ref 96–112)
GFR: 9.38 mL/min — AB (ref >60.00)
Glucose, Bld: 50 mg/dL — ABNORMAL LOW (ref 70–99)
Potassium: 5.6 mEq/L — ABNORMAL HIGH (ref 3.5–5.1)
Sodium: 138 mEq/L (ref 135–145)
TOTAL PROTEIN: 6.7 g/dL (ref 6.0–8.3)

## 2016-06-12 LAB — TSH: TSH: 1.84 u[IU]/mL (ref 0.35–4.50)

## 2016-06-12 LAB — LIPID PANEL
CHOLESTEROL: 280 mg/dL — AB (ref 0–200)
HDL: 88.5 mg/dL (ref >39.00)
LDL Cholesterol: 175 mg/dL — ABNORMAL HIGH (ref 0–99)
NonHDL: 191.82
TRIGLYCERIDES: 84 mg/dL (ref 0.0–149.0)
Total CHOL/HDL Ratio: 3
VLDL: 16.8 mg/dL (ref 0.0–40.0)

## 2016-06-12 LAB — VITAMIN B12: Vitamin B-12: 769 pg/mL (ref 211–911)

## 2016-06-12 MED ORDER — CARVEDILOL 25 MG PO TABS: 25.0000 mg | ORAL_TABLET | Freq: Two times a day (BID) | ORAL | 3 refills | Status: AC

## 2016-06-12 MED ORDER — AMLODIPINE BESYLATE 10 MG PO TABS
10.0000 mg | ORAL_TABLET | Freq: Every day | ORAL | 3 refills | Status: AC
Start: 2016-06-12 — End: ?

## 2016-06-12 NOTE — Telephone Encounter (Addendum)
CRITICAL VALUE STICKER  CRITICAL VALUE: Creat 7.91 and GFR 9.38  RECEIVER (on-site recipient of call):  Durward Fortes, RN   DATE & TIME NOTIFIED: 06/12/16 2:48pm  MESSENGER (representative from lab): Gordy Levan  MD NOTIFIED: Larose Kells   TIME OF NOTIFICATION:  2:55 pm  RESPONSE: Message routed to Dr. Larose Kells

## 2016-06-12 NOTE — Assessment & Plan Note (Signed)
HTN: Good compliance with amlodipine and carvedilol, ambulatory BPs 160/120. He uses a small cough that feels tight in his arm. He has large arms. BP today is acceptable. Recommend to continue same medications, nurse visit in 3 weeks for BP check, get a large cuff for ambulatory BPs. High cholesterol:  out of Lipitor for months ago. Check a FLP. He did respond very well to atorvastatin without apparent side effects.  Fatigue: History of snoring, Epworth scale 8 (average). Check a N99 and folic acid, Vitamin D, general labs including blood sugar RTC 3 months.

## 2016-06-12 NOTE — Progress Notes (Signed)
Pre visit review using our clinic review tool, if applicable. No additional management support is needed unless otherwise documented below in the visit note. 

## 2016-06-12 NOTE — Assessment & Plan Note (Addendum)
--  Td declined -- CCS: Never had a colonoscopy --prostate ca screening: At age 51 -- diet and exercise --tobacco- still smokes, quitting discussed, rec to see the dentist at least every 6 months --Labs: CMP, FLP, CBC, TSH, vitamin D, I34, folic acid.

## 2016-06-12 NOTE — Patient Instructions (Signed)
GO TO THE LAB : Get the blood work     [place] Schedule a nurse visit for blood pressure check 3 weeks from today Schedule your next appointment for a  checkup 3 months from today    Check the  blood pressure 2 or 3 times a week  Be sure your blood pressure is between 110/65 and  135/85. If it is consistently higher or lower, let me know  Please use a large cuff , does not need to feel tight on your arm  Bring your blood pressure readings when you come back to see my nurse    Steps to Quit Smoking Smoking tobacco can be harmful to your health and can affect almost every organ in your body. Smoking puts you, and those around you, at risk for developing many serious chronic diseases. Quitting smoking is difficult, but it is one of the best things that you can do for your health. It is never too late to quit. What are the benefits of quitting smoking? When you quit smoking, you lower your risk of developing serious diseases and conditions, such as:  Lung cancer or lung disease, such as COPD.  Heart disease.  Stroke.  Heart attack.  Infertility.  Osteoporosis and bone fractures. Additionally, symptoms such as coughing, wheezing, and shortness of breath may get better when you quit. You may also find that you get sick less often because your body is stronger at fighting off colds and infections. If you are pregnant, quitting smoking can help to reduce your chances of having a baby of low birth weight. How do I get ready to quit? When you decide to quit smoking, create a plan to make sure that you are successful. Before you quit:  Pick a date to quit. Set a date within the next two weeks to give you time to prepare.  Write down the reasons why you are quitting. Keep this list in places where you will see it often, such as on your bathroom mirror or in your car or wallet.  Identify the people, places, things, and activities that make you want to smoke (triggers) and  avoid them. Make sure to take these actions:  Throw away all cigarettes at home, at work, and in your car.  Throw away smoking accessories, such as Scientist, research (medical).  Clean your car and make sure to empty the ashtray.  Clean your home, including curtains and carpets.  Tell your family, friends, and coworkers that you are quitting. Support from your loved ones can make quitting easier.  Talk with your health care provider about your options for quitting smoking.  Find out what treatment options are covered by your health insurance. What strategies can I use to quit smoking? Talk with your healthcare provider about different strategies to quit smoking. Some strategies include:  Quitting smoking altogether instead of gradually lessening how much you smoke over a period of time. Research shows that quitting "cold [place]" is more successful than gradually quitting.  Attending in-person counseling to help you build problem-solving skills. You are more likely to have success in quitting if you attend several counseling sessions. Even short sessions of 10 minutes can be effective.  Finding resources and support systems that can help you to quit smoking and remain smoke-free after you quit. These resources are most helpful when you use them often. They can include:  Online chats with a Social worker.  Telephone quitlines.  Printed Furniture conservator/restorer.  Support groups  or group counseling.  Text messaging programs.  Mobile phone applications.  Taking medicines to help you quit smoking. (If you are pregnant or breastfeeding, talk with your health care provider first.) Some medicines contain nicotine and some do not. Both types of medicines help with cravings, but the medicines that include nicotine help to relieve withdrawal symptoms. Your health care provider may recommend:  Nicotine patches, gum, or lozenges.  Nicotine inhalers or sprays.  Non-nicotine medicine that is taken by  mouth. Talk with your health care provider about combining strategies, such as taking medicines while you are also receiving in-person counseling. Using these two strategies together makes you more likely to succeed in quitting than if you used either strategy on its own. If you are pregnant or breastfeeding, talk with your health care provider about finding counseling or other support strategies to quit smoking. Do not take medicine to help you quit smoking unless told to do so by your health care provider. What things can I do to make it easier to quit? Quitting smoking might feel overwhelming at first, but there is a lot that you can do to make it easier. Take these important actions:  Reach out to your family and friends and ask that they support and encourage you during this time. Call telephone quitlines, reach out to support groups, or work with a counselor for support.  Ask people who smoke to avoid smoking around you.  Avoid places that trigger you to smoke, such as bars, parties, or smoke-break areas at work.  Spend time around people who do not smoke.  Lessen stress in your life, because stress can be a smoking trigger for some people. To lessen stress, try:  Exercising regularly.  Deep-breathing exercises.  Yoga.  Meditating.  Performing a body scan. This involves closing your eyes, scanning your body from head to toe, and noticing which parts of your body are particularly tense. Purposefully relax the muscles in those areas.  Download or purchase mobile phone or tablet apps (applications) that can help you stick to your quit plan by providing reminders, tips, and encouragement. There are many free apps, such as QuitGuide from the [employer] Office manager for Disease Control and Prevention). You can find other support for quitting smoking (smoking cessation) through smokefree.gov and other websites. How will I feel when I quit smoking? Within the first 24 hours of quitting smoking, you  may start to feel some withdrawal symptoms. These symptoms are usually most noticeable 2-3 days after quitting, but they usually do not last beyond 2-3 weeks. Changes or symptoms that you might experience include:  Mood swings.  Restlessness, anxiety, or irritation.  Difficulty concentrating.  Dizziness.  Strong cravings for sugary foods in addition to nicotine.  Mild weight gain.  Constipation.  Nausea.  Coughing or a sore throat.  Changes in how your medicines work in your body.  A depressed mood.  Difficulty sleeping (insomnia). After the first 2-3 weeks of quitting, you may start to notice more positive results, such as:  Improved sense of smell and taste.  Decreased coughing and sore throat.  Slower heart rate.  Lower blood pressure.  Clearer skin.  The ability to breathe more easily.  Fewer sick days. Quitting smoking is very challenging for most people. Do not get discouraged if you are not successful the first time. Some people need to make many attempts to quit before they achieve long-term success. Do your best to stick to your quit plan, and talk with your health  care provider if you have any questions or concerns. This information is not intended to replace advice given to you by your health care provider. Make sure you discuss any questions you have with your health care provider. Document Released: 01/31/2001 Document Revised: 10/05/2015 Document Reviewed: 06/23/2014 Elsevier Interactive Patient Education  2017 [employer].

## 2016-06-12 NOTE — Telephone Encounter (Signed)
Full BMP panel pending at this point. Creatinine 7.9!. Advise patient: Kidney is not working well, we need to take care of this quickly. Recheck labs tomorrow to be sure this is not a lab error. Continue same medication and drink plenty of fluids.  Avoid taking any ibuprofen, Advil, BCs or any NSAIDs from this point on Labs for tomorrow: BMP, urinary sodium, urinary creatinine DX HTN. Arrange a renal ultrasound --- dx  increased creatinine. I'll call  nephrology were results

## 2016-06-12 NOTE — Progress Notes (Signed)
Subjective:    Patient ID: Chad Adkins., male    DOB: 05/12/1965, 51 y.o.   MRN: [id number]  DOS:  06/12/2016 Type of visit - description : cpx Interval history: Last his visit more than a year ago, we took about high blood pressure and cholesterol.  Wt Readings from Last 3 Encounters:  06/12/16 255 lb 6 oz (115.8 kg)  02/23/15 270 lb 8 oz (122.7 kg)  10/12/14 264 lb (119.7 kg)     Review of Systems In general feeling well except for the last 2 weeks: Feeling weak, lethargic. Denies specifically chest pain, difficulty breathing, lower extremity edema. He goes to the gym and exercises, no DOE. Denies any no CP, no anxiety or depression. has a ill-defined tingling of the feet and hands, mostly during the daytime, denies neck or back pain. He is not taking any new medication or OTC other than multivitamins   Other than above, a 14 point review of systems is negative    Past Medical History:  Diagnosis Date  . Abnormal ECG 07/16/2012   echo--- [facility]  . CRF (chronic renal failure) 07/16/2012  . Hypertension     Past Surgical History:  Procedure Laterality Date  . HERNIA REPAIR    . ROTATOR CUFF REPAIR Right     Social History   Social History  . Marital status: Single    Spouse name: N/A  . Number of children: 2  . Years of education: N/A   Occupational History  . Freight forwarder , [employer] store     Social History Main Topics  . Smoking status: Current Every Day Smoker    Types: Cigars  . Smokeless tobacco: Never Used     Comment: daily , 4-6 cigars a day  . Alcohol use 0.0 oz/week     Comment: socially   . Drug use: No  . Sexual activity: Not on file   Other Topics Concern  . Not on file   Social History Narrative   Lives by himself      Family History  Problem Relation Age of Onset  . Hyperlipidemia Father   . Hypertension Mother   . Breast cancer Mother   . CAD Neg Hx   . Diabetes Neg Hx   . Stroke Neg Hx   . Colon cancer Neg Hx   . Prostate cancer  Neg Hx      Allergies as of 06/12/2016   No Known Allergies     Medication List       Accurate as of 06/12/16  1:22 PM. Always use your most recent med list.          amLODipine 10 MG tablet Commonly known as:  NORVASC Take 1 tablet (10 mg total) by mouth daily.   aspirin 81 MG chewable tablet Chew 81 mg by mouth daily. Reported on 02/23/2015   carvedilol 25 MG tablet Commonly known as:  COREG Take 1 tablet (25 mg total) by mouth 2 (two) times daily with a meal.   CENTRUM SILVER PO Take by mouth.          Objective:   Physical Exam BP 132/80 (BP Location: Left Arm, Patient Position: Sitting, Cuff Size: Normal)   Pulse 82   Temp 98.6 F (37 C) (Oral)   Resp 14   Ht 6\' 3"  (1.905 m)   Wt 255 lb 6 oz (115.8 kg)   SpO2 97%   BMI 31.92 kg/m   General:   Well developed, well  nourished . NAD.  Neck: No  thyromegaly  HEENT:  Normocephalic . Face symmetric, atraumatic Lungs:  CTA B Normal respiratory effort, no intercostal retractions, no accessory muscle use. Heart: RRR,  no murmur.  No pretibial edema bilaterally  Normal femoral and pedal pulses Abdomen:  Not distended, soft, non-tender. No rebound or rigidity.   Skin: Exposed areas without rash. Not pale. Not jaundice Neurologic:  alert & oriented X3.  Speech normal, gait appropriate for age and unassisted Strength symmetric and appropriate for age.  Psych: Cognition and judgment appear intact.  Cooperative with normal attention span and concentration.  Behavior appropriate. No anxious or depressed appearing.    Assessment & Plan:  Assessment: HTN Hyperlipidemia Chronic renal failure. Renal US 07/2012 no RAS, h/o increase creat w/ HCTZ 2014 [facility]: Abnormal EKG --->  echo 07/2012: [facility] Snoring: Epworth (-) 09-2014  PLAN: HTN: Good compliance with amlodipine and carvedilol, ambulatory BPs 160/120. He uses a small cough that feels tight in his arm. He has large arms. BP today is acceptable. Recommend to  continue same medications, nurse visit in 3 weeks for BP check, get a large cuff for ambulatory BPs. High cholesterol:  out of Lipitor for months ago. Check a FLP. He did respond very well to atorvastatin without apparent side effects.  Fatigue: History of snoring, Epworth scale 8 (average). Check a T51 and folic acid, Vitamin D, general labs including blood sugar RTC 3 months.

## 2016-06-12 NOTE — Telephone Encounter (Signed)
Called patient with provider's recommendations.  He stated understanding and agreed with the plan.  Lab appt scheduled for tomorrow at 9:15 am.  Future labs and ultrasound ordered.

## 2016-06-13 ENCOUNTER — Other Ambulatory Visit (INDEPENDENT_AMBULATORY_CARE_PROVIDER_SITE_OTHER): Payer: BLUE CROSS/BLUE SHIELD

## 2016-06-13 ENCOUNTER — Telehealth: Payer: Self-pay | Admitting: *Deleted

## 2016-06-13 DIAGNOSIS — R7989 Other specified abnormal findings of blood chemistry: Secondary | ICD-10-CM

## 2016-06-13 DIAGNOSIS — I10 Essential (primary) hypertension: Secondary | ICD-10-CM

## 2016-06-13 DIAGNOSIS — N19 Unspecified kidney failure: Secondary | ICD-10-CM

## 2016-06-13 LAB — BASIC METABOLIC PANEL
BUN: 54 mg/dL — AB (ref 6–23)
CALCIUM: 8.8 mg/dL (ref 8.4–10.5)
CO2: 26 mEq/L (ref 19–32)
Chloride: 105 mEq/L (ref 96–112)
Creatinine, Ser: 7.8 mg/dL (ref 0.40–1.50)
GFR: 9.53 mL/min — CL (ref >60.00)
GLUCOSE: 111 mg/dL — AB (ref 70–99)
POTASSIUM: 5.7 meq/L — AB (ref 3.5–5.1)
Sodium: 137 mEq/L (ref 135–145)

## 2016-06-13 MED ORDER — SODIUM POLYSTYRENE SULFONATE 15 GM/60ML PO SUSP: 15.0000 g | Freq: Three times a day (TID) | ORAL | 0 refills | Status: DC

## 2016-06-13 NOTE — Telephone Encounter (Signed)
Labs collected.  Pt has an appt for Renal Ultrasound tomorrow (06/14/16) at 9:30a.

## 2016-06-13 NOTE — Telephone Encounter (Signed)
PCP informed verbally.

## 2016-06-13 NOTE — Telephone Encounter (Signed)
Follow-up BMP confirm elevated creatinine and potassium. I spoke with the patient --He was sick last month and took a number of OTCs including Motrin but nothing recently --Admit that he takes creatine supplement to build muscles. --Spoke with nephrology, they agree with the renal ultrasound, urine tests. We'll try to add a UA to see if there is proteinuria or microscopic hematuria. --Nephrology plans to call the patient and see him if possible this week --Stop aspirin, creatinine and any OTCs. Continue with BP meds -- send a Rx for KAYEXALATE 15 gr TID # 500 ml, no RF --Enter and urgent referral to nephrology, dx renal failure. --Try to add a UA to  the urine sample Patient agreement with the plan

## 2016-06-13 NOTE — Telephone Encounter (Signed)
Kayexalate sent to [facility], urgent referral placed to [facility] (Dr. Jimmy Footman). Per lab unable to add UA, urine was sent to [facility] lab.

## 2016-06-13 NOTE — Telephone Encounter (Signed)
[facility] lab reporting critical creatinine 7.8 & GFR 9.53

## 2016-06-14 LAB — SODIUM, URINE, RANDOM: Sodium, Ur: 58 mmol/L (ref 28–272)

## 2016-06-14 LAB — CREATININE, URINE, RANDOM: CREATININE, URINE: 81 mg/dL (ref 20–370)

## 2016-06-15 ENCOUNTER — Ambulatory Visit (HOSPITAL_BASED_OUTPATIENT_CLINIC_OR_DEPARTMENT_OTHER)
Admission: RE | Admit: 2016-06-15 | Discharge: 2016-06-15 | Disposition: A | Discharge status: Home / Self Care | Payer: BLUE CROSS/BLUE SHIELD | Source: Ambulatory Visit | Attending: Internal Medicine | Admitting: Internal Medicine

## 2016-06-15 DIAGNOSIS — R7989 Other specified abnormal findings of blood chemistry: Secondary | ICD-10-CM

## 2016-06-15 DIAGNOSIS — N281 Cyst of kidney, acquired: Secondary | ICD-10-CM | POA: Diagnosis not present

## 2016-06-15 LAB — VITAMIN D 1,25 DIHYDROXY
Vitamin D 1, 25 (OH)2 Total: 22 pg/mL (ref 18–72)
Vitamin D2 1, 25 (OH)2: <8 pg/mL
Vitamin D3 1, 25 (OH)2: 22 pg/mL

## 2016-06-15 NOTE — Telephone Encounter (Signed)
Spoke w/ Pt, he is feeling well, informed him of ultrasound results. He is taking Kayexalate as directed and saw Dr. [name] at [facility] yesterday (06/14/2016) and will follow-up with him in 2 weeks.

## 2016-06-15 NOTE — Telephone Encounter (Signed)
Please check on the patient, how is  he feeling? Taking kayexalate? Has he been able to set up an appointment with [facility]?

## 2016-06-16 NOTE — Telephone Encounter (Signed)
thx

## 2016-06-23 ENCOUNTER — Encounter (HOSPITAL_COMMUNITY): Payer: Self-pay | Admitting: Emergency Medicine

## 2016-06-23 ENCOUNTER — Inpatient Hospital Stay (HOSPITAL_COMMUNITY): Payer: BLUE CROSS/BLUE SHIELD

## 2016-06-23 ENCOUNTER — Telehealth: Payer: Self-pay | Admitting: Internal Medicine

## 2016-06-23 ENCOUNTER — Inpatient Hospital Stay (HOSPITAL_COMMUNITY)
Admission: EM | Admit: 2016-06-23 | Discharge: 2016-06-25 | DRG: 683 | Disposition: A | Discharge status: Home / Self Care | Payer: BLUE CROSS/BLUE SHIELD | Attending: Internal Medicine | Admitting: Internal Medicine

## 2016-06-23 ENCOUNTER — Encounter: Payer: Self-pay | Admitting: Internal Medicine

## 2016-06-23 DIAGNOSIS — N179 Acute kidney failure, unspecified: Secondary | ICD-10-CM | POA: Diagnosis not present

## 2016-06-23 DIAGNOSIS — N19 Unspecified kidney failure: Secondary | ICD-10-CM

## 2016-06-23 DIAGNOSIS — F17290 Nicotine dependence, other tobacco product, uncomplicated: Secondary | ICD-10-CM | POA: Diagnosis present

## 2016-06-23 DIAGNOSIS — Z8249 Family history of ischemic heart disease and other diseases of the circulatory system: Secondary | ICD-10-CM | POA: Diagnosis not present

## 2016-06-23 DIAGNOSIS — Z8673 Personal history of transient ischemic attack (TIA), and cerebral infarction without residual deficits: Secondary | ICD-10-CM

## 2016-06-23 DIAGNOSIS — D649 Anemia, unspecified: Secondary | ICD-10-CM | POA: Diagnosis present

## 2016-06-23 DIAGNOSIS — R809 Proteinuria, unspecified: Secondary | ICD-10-CM | POA: Diagnosis present

## 2016-06-23 DIAGNOSIS — E785 Hyperlipidemia, unspecified: Secondary | ICD-10-CM | POA: Diagnosis present

## 2016-06-23 DIAGNOSIS — I120 Hypertensive chronic kidney disease with stage 5 chronic kidney disease or end stage renal disease: Secondary | ICD-10-CM | POA: Diagnosis present

## 2016-06-23 DIAGNOSIS — I10 Essential (primary) hypertension: Secondary | ICD-10-CM | POA: Diagnosis present

## 2016-06-23 DIAGNOSIS — R55 Syncope and collapse: Secondary | ICD-10-CM

## 2016-06-23 DIAGNOSIS — M791 Myalgia: Secondary | ICD-10-CM | POA: Diagnosis present

## 2016-06-23 DIAGNOSIS — I6381 Other cerebral infarction due to occlusion or stenosis of small artery: Secondary | ICD-10-CM

## 2016-06-23 DIAGNOSIS — N185 Chronic kidney disease, stage 5: Secondary | ICD-10-CM | POA: Diagnosis present

## 2016-06-23 DIAGNOSIS — E875 Hyperkalemia: Secondary | ICD-10-CM

## 2016-06-23 DIAGNOSIS — I951 Orthostatic hypotension: Secondary | ICD-10-CM

## 2016-06-23 DIAGNOSIS — Z79899 Other long term (current) drug therapy: Secondary | ICD-10-CM

## 2016-06-23 LAB — CBG MONITORING, ED
GLUCOSE-CAPILLARY: 67 mg/dL (ref 65–99)
GLUCOSE-CAPILLARY: 63 mg/dL — AB (ref 65–99)

## 2016-06-23 LAB — URINALYSIS, ROUTINE W REFLEX MICROSCOPIC
Bilirubin Urine: NEGATIVE
Glucose, UA: NEGATIVE mg/dL
Hgb urine dipstick: NEGATIVE
Ketones, ur: NEGATIVE mg/dL
Leukocytes, UA: NEGATIVE
NITRITE: NEGATIVE
Protein, ur: >=300 mg/dL — AB
SPECIFIC GRAVITY, URINE: 1.003 — AB (ref 1.005–1.030)
SQUAMOUS EPITHELIAL / LPF: NONE SEEN
pH: 7 (ref 5.0–8.0)

## 2016-06-23 LAB — BASIC METABOLIC PANEL
ANION GAP: 6 (ref 5–15)
BUN: 51 mg/dL — ABNORMAL HIGH (ref 6–20)
CO2: 25 mmol/L (ref 22–32)
Calcium: 8.4 mg/dL — ABNORMAL LOW (ref 8.9–10.3)
Chloride: 101 mmol/L (ref 101–111)
Creatinine, Ser: 8.91 mg/dL — ABNORMAL HIGH (ref 0.61–1.24)
GFR calc Af Amer: 7 mL/min — ABNORMAL LOW (ref >60)
GFR, EST NON AFRICAN AMERICAN: 6 mL/min — AB (ref >60)
GLUCOSE: 82 mg/dL (ref 65–99)
POTASSIUM: 7.1 mmol/L — AB (ref 3.5–5.1)
SODIUM: 132 mmol/L — AB (ref 135–145)

## 2016-06-23 LAB — CBC WITH DIFFERENTIAL/PLATELET
Basophils Absolute: 0 10*3/uL (ref 0.0–0.1)
Basophils Relative: 1 %
Eosinophils Absolute: 0.1 10*3/uL (ref 0.0–0.7)
Eosinophils Relative: 2 %
HCT: 32.9 % — ABNORMAL LOW (ref 39.0–52.0)
HEMOGLOBIN: 10.3 g/dL — AB (ref 13.0–17.0)
LYMPHS ABS: 1.6 10*3/uL (ref 0.7–4.0)
LYMPHS PCT: 33 %
MCH: 26.7 pg (ref 26.0–34.0)
MCHC: 31.3 g/dL (ref 30.0–36.0)
MCV: 85.2 fL (ref 78.0–100.0)
Monocytes Absolute: 0.7 10*3/uL (ref 0.1–1.0)
Monocytes Relative: 14 %
NEUTROS ABS: 2.3 10*3/uL (ref 1.7–7.7)
NEUTROS PCT: 50 %
Platelets: 244 10*3/uL (ref 150–400)
RBC: 3.86 MIL/uL — AB (ref 4.22–5.81)
RDW: 14 % (ref 11.5–15.5)
WBC: 4.6 10*3/uL (ref 4.0–10.5)

## 2016-06-23 LAB — SODIUM, URINE, RANDOM

## 2016-06-23 LAB — CREATININE, URINE, RANDOM: CREATININE, URINE: 60.03 mg/dL

## 2016-06-23 LAB — PHOSPHORUS: Phosphorus: 4.2 mg/dL (ref 2.5–4.6)

## 2016-06-23 LAB — MAGNESIUM: MAGNESIUM: 2.3 mg/dL (ref 1.7–2.4)

## 2016-06-23 LAB — CK: CK TOTAL: 365 U/L (ref 49–397)

## 2016-06-23 MED ORDER — SODIUM POLYSTYRENE SULFONATE 15 GM/60ML PO SUSP: 45.0000 g | Freq: Once | ORAL | Status: DC

## 2016-06-23 MED ORDER — FUROSEMIDE 10 MG/ML IJ SOLN: 80.0000 mg | Freq: Two times a day (BID) | INTRAMUSCULAR | Status: DC

## 2016-06-23 MED ORDER — HEPARIN SODIUM (PORCINE) 5000 UNIT/ML IJ SOLN
5000.0000 [IU] | Freq: Three times a day (TID) | INTRAMUSCULAR | Status: DC
  Administered 2016-06-24: 5000 [IU] via SUBCUTANEOUS
  Filled 2016-06-23: qty 1

## 2016-06-23 MED ORDER — DEXTROSE 50 % IV SOLN
1.0000 | Freq: Once | INTRAVENOUS | Status: AC
  Administered 2016-06-23: 50 mL via INTRAVENOUS
  Filled 2016-06-23: qty 50

## 2016-06-23 MED ORDER — ONDANSETRON HCL 4 MG/2ML IJ SOLN: 4.0000 mg | Freq: Four times a day (QID) | INTRAMUSCULAR | Status: DC | PRN

## 2016-06-23 MED ORDER — CARVEDILOL 25 MG PO TABS
25.0000 mg | ORAL_TABLET | Freq: Two times a day (BID) | ORAL | Status: DC
  Administered 2016-06-23 – 2016-06-24 (×3): 25 mg via ORAL
  Filled 2016-06-23 (×4): qty 1

## 2016-06-23 MED ORDER — SODIUM CHLORIDE 0.45 % IV SOLN
INTRAVENOUS | Status: DC
  Administered 2016-06-23: 17:00:00 via INTRAVENOUS

## 2016-06-23 MED ORDER — INSULIN ASPART 100 UNIT/ML IV SOLN
5.0000 [IU] | Freq: Once | INTRAVENOUS | Status: AC
  Administered 2016-06-23: 5 [IU] via INTRAVENOUS
  Filled 2016-06-23: qty 0.05

## 2016-06-23 MED ORDER — ALBUTEROL SULFATE (2.5 MG/3ML) 0.083% IN NEBU
10.0000 mg | INHALATION_SOLUTION | Freq: Once | RESPIRATORY_TRACT | Status: DC
  Filled 2016-06-23: qty 12

## 2016-06-23 MED ORDER — SODIUM CHLORIDE 0.9 % IV SOLN
1.0000 g | Freq: Once | INTRAVENOUS | Status: AC
  Administered 2016-06-23: 1 g via INTRAVENOUS
  Filled 2016-06-23: qty 10

## 2016-06-23 MED ORDER — SODIUM POLYSTYRENE SULFONATE 15 GM/60ML PO SUSP
60.0000 g | Freq: Once | ORAL | Status: AC
  Administered 2016-06-23: 60 g via ORAL
  Filled 2016-06-23: qty 240

## 2016-06-23 MED ORDER — LACTATED RINGERS IV SOLN
INTRAVENOUS | Status: DC
  Administered 2016-06-23: 19:00:00 via INTRAVENOUS

## 2016-06-23 MED ORDER — SODIUM POLYSTYRENE SULFONATE 15 GM/60ML PO SUSP: 15.0000 g | Freq: Three times a day (TID) | ORAL | 0 refills | Status: DC

## 2016-06-23 MED ORDER — AMLODIPINE BESYLATE 10 MG PO TABS: 10.0000 mg | ORAL_TABLET | Freq: Every day | ORAL | Status: DC

## 2016-06-23 MED ORDER — BISACODYL 5 MG PO TBEC: 5.0000 mg | DELAYED_RELEASE_TABLET | Freq: Every day | ORAL | Status: DC | PRN

## 2016-06-23 MED ORDER — ONDANSETRON HCL 4 MG PO TABS: 4.0000 mg | ORAL_TABLET | Freq: Four times a day (QID) | ORAL | Status: DC | PRN

## 2016-06-23 MED ORDER — ALBUTEROL (5 MG/ML) CONTINUOUS INHALATION SOLN
INHALATION_SOLUTION | RESPIRATORY_TRACT | Status: AC
  Administered 2016-06-23: 17:00:00
  Filled 2016-06-23: qty 20

## 2016-06-23 NOTE — ED Provider Notes (Signed)
[place] DEPT Provider Note   CSN: [id number] Arrival date & time: 06/23/16  1412     History   Chief Complaint Chief Complaint  Patient presents with  . Abnormal Lab    HPI Chad Adkins. is a 51 y.o. male with a PMHx of LVH, new diagnosis of CKD5/CRF, HTN, and HLD, who presents to the ED referred by his nephrologist Dr. [name] for an abnormal lab value. Chart review reveals that he saw his PCP Dr. Larose Kells at [facility] on 06/12/16 and had labs done as part of his annual physical, Cr noted to be 7.91 (last values from June and Aug 2016 were 2.28 and 2.23 respectively) as well as K 5.6. He was referred to [facility], had repeat labs 06/13/16 at which time Cr 7.8, K 5.7, UNa and UCr WNL; saw Dr. [name] on 06/14/16 and was advised to stay on Kayexalate (per PCP's notes; Dr. Jason Nest notes not in our system); however, unfortunately it seems that he only ever had 1 rx for Kayexalate from his PCP and it was only an ~3day supply; he completed this amount, and didn't have any refills and wasn't aware that he was supposed to be on it still. Had renal U/S 06/15/16 which showed increased echotexture of b/l kidneys compatible with CKD, and benign R renal cysts. He had his f/up visit with Dr. [name] today and lab work at his office showed K 7.2. Ironically, the pt actually called his PCP this morning to inquire about whether he was supposed to still be taking the kayexalate or not, since he had no refill, and the PCP confirmed that the nephrology notes advised that he continue it at that initial visit 2wks ago; they attempted to call pt, but his voicemail box was full so they couldn't reach him, but they did call in the refill rx at [place].   Overall patient states that he continues to feel fatigued, continues to have muscle twitching, and has a sensation in his hands and feet that he describes as a cold feeling and hypersensitive sensation. He has felt these symptoms for about one  month, starting in the first week of April. Just prior to that he had a cold and he was taking "a lot" of over-the-counter cold medicines that included NSAIDs. He had never previously been told he had any kidney dysfunction/failure, and prior to last month, he was relatively healthy and worked out regularly, however hasn't been able to work out in the last several weeks. He's been eating super healthy foods, but staying away from bananas, in order to help with his K value. He took the 3 day supply of Kayexalate but didn't realize he was supposed to continue taking it. That supply of Kayexalate didn't seem to really change his symptoms. No known aggravating factors. He denies any metallic taste in his mouth, fevers, chills, CP, SOB, abdominal pain, n/v/d/c, dysuria, hematuria, decreased urine output, numbness, tingling, focal weakness, or any other complaints at this time.   The history is provided by the patient and medical records. No language interpreter was used.  Abnormal Lab  Time since result:  Several hours Patient referred by:  Specialist Resulting agency:  External Result type: chemistry   Chemistry:    Potassium:  High   Past Medical History:  Diagnosis Date  . Abnormal ECG 07/16/2012   echo--- LVH  . Chronic kidney disease, stage V ([place]) 2018  . CRF (chronic renal failure) 2018  . H/O hyperkalemia   .  Hypertension   . Kidney failure, acute ([place]) 2018    Patient Active Problem List   Diagnosis Date Noted  . PCP NOTES >>>>>>>>>>>>>> 06/12/2016  . Snoring 10/13/2014  . Hyperlipidemia 10/12/2014  . Annual physical exam 08/18/2014  . TIA (transient ischemic attack) 07/16/2012  . Abnormal ECG 07/16/2012  . HTN (hypertension) 07/16/2012  . CRF (chronic renal failure) 07/16/2012    Past Surgical History:  Procedure Laterality Date  . HERNIA REPAIR    . ROTATOR CUFF REPAIR Right        Home Medications    Prior to Admission medications   Medication Sig Start Date End  Date Taking? Authorizing Provider  amLODipine (NORVASC) 10 MG tablet Take 1 tablet (10 mg total) by mouth daily. 06/12/16  Yes Colon Branch, MD  carvedilol (COREG) 25 MG tablet Take 1 tablet (25 mg total) by mouth 2 (two) times daily with a meal. 06/12/16  Yes Colon Branch, MD  Multiple Vitamins-Minerals (CENTRUM SILVER PO) Take by mouth.   Yes Historical Provider, MD  sodium polystyrene (KAYEXALATE) 15 GM/60ML suspension Take 60 mLs (15 g total) by mouth 3 (three) times daily. 06/23/16  Yes Colon Branch, MD    Family History Family History  Problem Relation Age of Onset  . Hyperlipidemia Father   . Hypertension Mother   . Breast cancer Mother   . CAD Neg Hx   . Diabetes Neg Hx   . Stroke Neg Hx   . Colon cancer Neg Hx   . Prostate cancer Neg Hx     Social History Social History  Substance Use Topics  . Smoking status: Current Every Day Smoker    Types: Cigars  . Smokeless tobacco: Never Used     Comment: daily , 4-6 cigars a day  . Alcohol use 0.0 oz/week     Comment: socially      Allergies   Patient has no known allergies.   Review of Systems Review of Systems  Constitutional: Positive for fatigue. Negative for chills and fever.       +hands/feet feel cold and hypersensitive  HENT:       No metallic taste in mouth  Respiratory: Negative for shortness of breath.   Cardiovascular: Negative for chest pain.  Gastrointestinal: Negative for abdominal pain, constipation, diarrhea, nausea and vomiting.  Genitourinary: Negative for decreased urine volume, difficulty urinating, dysuria and hematuria.  Musculoskeletal: Positive for myalgias (muscle twitching). Negative for arthralgias.  Skin: Negative for color change.  Allergic/Immunologic: Negative for immunocompromised state.  Neurological: Negative for weakness and numbness.  Psychiatric/Behavioral: Negative for confusion.   All other systems reviewed and are negative for acute change except as noted in the HPI.    Physical  Exam Updated Vital Signs BP (!) 158/100 (BP Location: Left Arm)   Pulse 61   Temp 98.5 F (36.9 C) (Oral)   Resp 16   Ht 6\' 4"  (1.93 m)   Wt 114 kg   SpO2 100%   BMI 30.60 kg/m   Physical Exam  Constitutional: He is oriented to person, place, and time. Vital signs are normal. He appears well-developed and well-nourished.  Non-toxic appearance. No distress.  Afebrile, nontoxic, NAD  HENT:  Head: Normocephalic and atraumatic.  Mouth/Throat: Oropharynx is clear and moist and mucous membranes are normal.  Eyes: Conjunctivae and EOM are normal. Right eye exhibits no discharge. Left eye exhibits no discharge.  Neck: Normal range of motion. Neck supple.  Cardiovascular: Normal rate, regular rhythm, normal  heart sounds and intact distal pulses.  Exam reveals no gallop and no friction rub.   No murmur heard. Pulmonary/Chest: Effort normal and breath sounds normal. No respiratory distress. He has no decreased breath sounds. He has no wheezes. He has no rhonchi. He has no rales.  Abdominal: Soft. Normal appearance and bowel sounds are normal. He exhibits no distension. There is no tenderness. There is no rigidity, no rebound, no guarding, no CVA tenderness, no tenderness at McBurney's point and negative Murphy's sign.  Musculoskeletal: Normal range of motion.  Neurological: He is alert and oriented to person, place, and time. He has normal strength. No sensory deficit.  Skin: Skin is warm, dry and intact. No rash noted.  Psychiatric: He has a normal mood and affect.  Nursing note and vitals reviewed.    ED Treatments / Results  Labs (all labs ordered are listed, but only abnormal results are displayed) Labs Reviewed  BASIC METABOLIC PANEL - Abnormal; Notable for the following:       Result Value   Sodium 132 (*)    Potassium 7.1 (*)    BUN 51 (*)    Creatinine, Ser 8.91 (*)    Calcium 8.4 (*)    GFR calc non Af Amer 6 (*)    GFR calc Af Amer 7 (*)    All other components within  normal limits  CBC WITH DIFFERENTIAL/PLATELET - Abnormal; Notable for the following:    RBC 3.86 (*)    Hemoglobin 10.3 (*)    HCT 32.9 (*)    All other components within normal limits  URINALYSIS, ROUTINE W REFLEX MICROSCOPIC - Abnormal; Notable for the following:    Color, Urine STRAW (*)    Specific Gravity, Urine 1.003 (*)    Protein, ur >=300 (*)    Bacteria, UA RARE (*)    All other components within normal limits  MAGNESIUM  PHOSPHORUS  GLUCOSE, RANDOM  CK  CBG MONITORING, ED    EKG  EKG Interpretation  Date/Time:  Friday Jun 23 2016 16:00:02 EDT Ventricular Rate:  57 PR Interval:    QRS Duration: 96 QT Interval:  467 QTC Calculation: 455 R Axis:   39 Text Interpretation:  Sinus rhythm Borderline prolonged PR interval RSR' in V1 or V2, probably normal variant Confirmed by Jeneen Rinks  MD, [place] ([id number]) on 06/23/2016 4:04:49 PM       Radiology No results found.   Renal U/S 06/15/16 Study Result  CLINICAL DATA:  Elevated creatinine  EXAM: RENAL / URINARY TRACT ULTRASOUND COMPLETE  COMPARISON:  08/02/2012  FINDINGS: Right Kidney:  Length: 10.9 cm. 3.7 cm simple appearing cyst in the midpole with adjacent 1 cm cyst. Diffusely increased echotexture throughout the right kidney. No hydronephrosis.  Left Kidney:  Length: 10.0 cm. Diffusely increased echotexture throughout the left kidney. No hydronephrosis.  Bladder:  Appears normal for degree of bladder distention.  IMPRESSION: Increased echotexture within the kidneys bilaterally compatible chronic medical renal disease.  Benign appearing right renal cysts.  No acute findings.   Electronically Signed   By: Rolm Baptise M.D.   On: 06/15/2016 10:28     Procedures Procedures (including critical care time)  CRITICAL CARE-- hyperkalemia, ARF Performed by: Reece Agar   Total critical care time: 60 minutes  Critical care time was exclusive of separately billable procedures and  treating other patients.  Critical care was necessary to treat or prevent imminent or life-threatening deterioration.  Critical care was time spent personally by me on the  following activities: development of treatment plan with patient and/or surrogate as well as nursing, discussions with consultants, evaluation of patient's response to treatment, examination of patient, obtaining history from patient or surrogate, ordering and performing treatments and interventions, ordering and review of laboratory studies, ordering and review of radiographic studies, pulse oximetry and re-evaluation of patient's condition.   Medications Ordered in ED Medications  calcium gluconate 1 g in sodium chloride 0.9 % 100 mL IVPB (1 g Intravenous New Bag/Given 06/23/16 1713)  albuterol (PROVENTIL) (2.5 MG/3ML) 0.083% nebulizer solution 10 mg (10 mg Nebulization Not Given 06/23/16 1630)  insulin aspart (novoLOG) injection 5 Units (not administered)  sodium polystyrene (KAYEXALATE) 15 GM/60ML suspension 45 g (not administered)  0.45 % sodium chloride infusion ( Intravenous New Bag/Given 06/23/16 1709)  dextrose 50 % solution 50 mL (50 mLs Intravenous Given 06/23/16 1701)  albuterol (PROVENTIL, VENTOLIN) (5 MG/ML) 0.5% continuous inhalation solution (  Given 06/23/16 1701)     Initial Impression / Assessment and Plan / ED Course  I have reviewed the triage vital signs and the nursing notes.  Pertinent labs & imaging results that were available during my care of the patient were reviewed by me and considered in my medical decision making (see chart for details).     51 y.o. male here with abnormal labs done at Dr. Jason Nest office; K 7.2 there. At his PCP's office 2 weeks ago he had labs that showed acute renal failure (Cr 7.9, K 5.6), started on kayexalate and had urgent referral to [facility]; had U/S showing CKD findings and R renal benign cysts, per PCP notes nephrology advised he continue the Kayexalate,  but unfortunately his original rx was only ~3 day supply when in reality he was supposed to stay on it. He even called his PCP inquiring about this today and they refilled it, but couldn't leave a message with him because his mailbox is full. He followed up with Dr. [name] today and labs were abnormal so they sent him here. He's had fatigue, muscle twitches, and hands/feet feel cold and sensitive x1 month, after he had a cold and took a large amount of NSAIDs; this likely contributed to his ARF. Will repeat labs today to ensure accuracy and verify values since we don't have the nephrology lab values here. Will also get Mg and Phos levels, U/A, and EKG. If K accurately that high, will likely need to discuss with Dr. Webb/nephrology and potentially admit. Will start with IV Ca2+ to stabilize muscle while we confirm lab values on BMP. Discussed case with my attending Dr. Ellender Hose who agrees with plan. Will reassess shortly  4:30 PM CBC w/diff showing stable anemia (although this anemia is new comparative to last year, but today's values stable to 11 days ago). BMP confirming K 7.1, Na 132, BUN 51, Cr 8.91. Will start hyperkalemia protocol and consult nephrology. Mg, Phos, and U/A pending. EKG with borderline prolonged PR, slightly peaked T's although not high, just sharp, narrow QRS; but otherwise no acute ischemic findings. Will consult nephrology and likely admit.   4:56 PM Dr. Jonnie Finner of nephrology returning page, wants [facility] transferred to [facility] and admitted via hospitalist service. Will consult them now. Also wants 45g Kayexalate started, and 1/2NS infusion at 75 ml/hr. Will consult while he's admitted. Of note, U/A with proteinuria, no evidence of infection. Mg and Phos WNL.   5:16 PM Dr. Elon Jester of [facility] returning page and will admit, transferring to [facility] but admitting under [facility] service. Holding  orders to be placed by admitting team. Please see their notes for further documentation of care. I appreciate their  help with this pleasant pt's care. Pt stable at time of admission.    Final Clinical Impressions(s) / ED Diagnoses   Final diagnoses:  Hyperkalemia  Acute renal failure, unspecified acute renal failure type (HCC)  Anemia, unspecified type  Proteinuria, unspecified type    New Prescriptions New Prescriptions   No medications on file       [address] PA-C 06/23/16 1717    Duffy Bruce, MD 06/24/16 1129    Duffy Bruce, MD 06/24/16 1130

## 2016-06-23 NOTE — Telephone Encounter (Signed)
Please advise 

## 2016-06-23 NOTE — Consult Note (Signed)
Chad Adkins. Admit Date: 06/23/2016 06/23/2016 Chad Adkins Requesting Physician:  Chad Jester MD  Reason for Consult:  Renal Faiulre, Hyperkalemia HPI:  51 year old male who presented was referred to the emergency room from our clinic after seeing Dr. Justin Adkins today for worsening renal failure and hyperkalemia.  PMH Incudes:  Hypertension, present at least 10 years, appears to be chronically uncontrolled currently on amlodipine and carvedilol; Notes mention chronic blood pressures in the 160s /120s.  2014 TTE notes moderate LVH with normal systolic function  No family history of ESRD, transplant  No personal history of diabetes  Patient has been very focused on physical fitness for much of his life, especially bodybuilding and weight lifting. Approximate one month ago he noticed decreased stamina and less strength with his workouts. He saw primary care for evaluation where renal failure was noted with a creatinine of 7.9 and potassium of 5.6. He was referred to our clinic and saw Dr. Justin Adkins first on 4/25 and then again today.at our office testing confirmed very little kidney function and mild hyperkalemia. Serologies were ordered including negative hepatitis C, hepatitis B, HIV, ANA, normal C3 and C4, negative SPEP, negative random UPEP, negative double-stranded DNA. Urine protein creatinine ratio demonstrated borderline nephrotic proteinuria. He returned for her visit today he was referred for kidney options/dialysis preparation as well as consideration of transplant. Unfortunately his potassium was elevated at greater than 7.2 and he was asked to come the emergency room.  The patient was taking protein supplements with his bodybuilding but has not been doing several the past month. He also was having quite a bit of cramping when he was working out and was taking several bananas a day, though he has stopped that as well. When he does tell me is that every day he goes 2 or 3 where he drinks 2 juices  and then is eating a heavy fruit and vegetarian diet. He is not using NSAIDs.  He denies anorexia, nausea/vomiting, dysgeusia, hiccups, itching, dyspnea, chest pain. He does think that he has been a little bit more dull cognitively. He has noticed some twitching in his muscles.   Renal ultrasound has demonstrated normal size kidneys with increased echogenicity and no obstruction.EKG shows a narrow QRS without peaked T waves.  His hyperkalemia has a medically managed with albuterol, insulin and dextrose,Kayexalate, calcium gluconate. He is now on lactated Ringer's solution.    Creatinine, Ser (mg/dL)  Date Value  06/23/2016 8.91 (H)  06/13/2016 7.80 (HH)  06/12/2016 7.91 (HH)  10/12/2014 2.23 (H)  08/19/2014 2.28 (H)  09/20/2012 1.8 (H)  07/22/2012 1.8 (H)  06/24/2012 1.68 (H)  ] I/Os:  ROS NSAIDS: none IV Contrast none TMP/SMX none Hypotension none Balance of 12 systems is negative w/ exceptions as above  PMH  Past Medical History:  Diagnosis Date  . Abnormal ECG 07/16/2012   echo--- LVH  . Chronic kidney disease, stage V ([place]) 2018  . CRF (chronic renal failure) 2018  . H/O hyperkalemia   . Hypertension   . Kidney failure, acute ([place]) 2018   [place]  Past Surgical History:  Procedure Laterality Date  . HERNIA REPAIR    . ROTATOR CUFF REPAIR Right    FH  Family History  Problem Relation Age of Onset  . Hyperlipidemia Father   . Hypertension Mother   . Breast cancer Mother   . CAD Neg Hx   . Diabetes Neg Hx   . Stroke Neg Hx   . Chad cancer Neg Hx   .  Prostate cancer Neg Hx    SH  reports that he has been smoking Cigars.  He has never used smokeless tobacco. He reports that he drinks alcohol. He reports that he does not use drugs. Allergies No Known Allergies Home medications Prior to Admission medications   Medication Sig Start Date End Date Taking? Authorizing Provider  amLODipine (NORVASC) 10 MG tablet Take 1 tablet (10 mg total) by mouth daily. 06/12/16   Yes Chad Branch, MD  carvedilol (COREG) 25 MG tablet Take 1 tablet (25 mg total) by mouth 2 (two) times daily with a meal. 06/12/16  Yes Chad Branch, MD  Multiple Vitamins-Minerals (CENTRUM SILVER PO) Take by mouth.   Yes Historical Provider, MD  sodium polystyrene (KAYEXALATE) 15 GM/60ML suspension Take 60 mLs (15 g total) by mouth 3 (three) times daily. 06/23/16  Yes Chad Branch, MD    Current Medications Scheduled Meds: . amLODipine  10 mg Oral Daily  . carvedilol  25 mg Oral BID WC  . heparin  5,000 Units Subcutaneous Q8H   Continuous Infusions: . lactated ringers 125 mL/hr at 06/23/16 1845   PRN Meds:.bisacodyl, ondansetron **OR** ondansetron (ZOFRAN) IV  CBC  Recent Labs Lab 06/23/16 1531  WBC 4.6  NEUTROABS 2.3  HGB 10.3*  HCT 32.9*  MCV 85.2  PLT 831   Basic Metabolic Panel  Recent Labs Lab 06/23/16 1531  NA 132*  K 7.1*  CL 101  CO2 25  GLUCOSE 82  BUN 51*  CREATININE 8.91*  CALCIUM 8.4*  PHOS 4.2    Physical Exam  Blood pressure (!) 162/120, pulse (!) 53, temperature 98.5 F (36.9 C), temperature source Oral, resp. rate 19, height 6\' 4"  (1.93 m), weight 114 kg (251 lb 6 oz), SpO2 100 %. GEN: no acute distress, above average muscle tone/bulk ENT: NCAT EYES: EOMI CV: RRR, no murmur rub or gallop. Normal S1 and S2 PULM: clear bilaterally, normal work of breathing, speaks in full sentences unlabored ABD: soft, nontender, nondistended, no abdominal bruits SKIN: no rashes or lesions EXT:no edema NEURO: Nonfocal, alert and oriented   Assessment 12M with progressive CKD and Hyperkalemia.  He likely has progressive renal failure from uncontrolled hypertension although serologically negative glomerular disease could be present such as FSGS, I don't think that pursuing the diagnosis will be useful. His hyperkalemia is related to low GFR and high dietary potassium intake in the form of juicing and other heavy plant-based foods. With the exception of the  hyperkalemia he overall is pretty well compensated, specifically volume status is stable, no other uremic symptoms. Blood pressure does need to improve.  1. 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 Hyperkalemia, > 7, w/o EKG changes related to #1 and high diteary K intake 3. Anemia, normocytic; w/u sent by primary service 4. Mild 2HPTH (Based on outpt labs) with nl P and Ca  Plan 1. Stop LR, it has potassium in it 2. Start lasix 80 IV BID 3. f/u labs from tonight 4. If K is not improving will most likely need HD 5. Given no other uremic symptoms,If we can control his serum potassium. He potentially could follow-up closely with Dr. Justin Adkins and avoid starting dialysis in the hospital 6. Daily weights, Daily Renal Panel, Strict I/Os, Avoid nephrotoxins (NSAIDs, judicious IV Contrast) 7. Nutrition consult for low potassium, low-sodium diet 8. NPO p MN in case needs HD cath 9. Check PT and PTT   Pearson Grippe MD [phone] pgr 06/23/2016, 8:32 PM

## 2016-06-23 NOTE — Telephone Encounter (Signed)
Caller name: Relationship to patient: Self Can be reached: [phone]  Pharmacy:  Reason for call: Patient wants to know if he is to continue to take sodium polystyrene (KAYEXALATE) 15 GM/60ML suspension [[phone]]

## 2016-06-23 NOTE — Telephone Encounter (Signed)
Nephrology note, they advised him to continue Kayexalate. Refill for one month

## 2016-06-23 NOTE — ED Notes (Signed)
Delay in lab draw due to pt not receiving insulin at this time

## 2016-06-23 NOTE — Telephone Encounter (Signed)
Tried calling Pt, no answer, voicemail box full, unable to leave message. Kayexalate refilled to [facility].

## 2016-06-23 NOTE — H&P (Signed)
History and Physical    Chad Adkins. OXB:[id number] DOB: [birth year]  DOA: 06/23/2016 PCP: Colon Branch, MD  Patient coming from: Home   Chief Complaint: Muscle pain   HPI: Chad Adkins. is a 51 y.o. male with medical history significant of hypertension and chronic kidney disease stage III presented to the emergency department after being seen by his PCP and found that he has a potassium level of 7.1 and a creatinine of 8.91. Patient reported he saw his PCP a week ago and potassium was found to be 5.7 he was prescribed Kayexalate and has been taken it for the past week which he reported making constipated. Patient denies use of NSAIDs. Patient reported using the gym frequently and doing heavy lifting. He has been complaining of leg cramps for about a week and he has increases banana intake about 2-3 per day. Patient was sent to [facility] for further evaluation. Nephrology was consulted and recommended admission at [facility] patient will be transferred for further treatment. Patient denies chest pain, shortness of breath, nausea, vomiting, abdominal pain and dizziness.  ED Course:  Creatinine 8.91, BUN 51, potassium 7.1, sodium 132. Hemoglobin 10.3  Review of Systems:   General: no changes in body weight, no fever chills or decrease in energy.  HEENT: no blurry vision, hearing changes or sore throat Respiratory: no dyspnea, coughing, wheezing CV: no chest pain, no palpitations GI: no nausea, vomiting, abdominal pain, diarrhea, constipation GU: no dysuria, burning on urination, increased urinary frequency, hematuria  Ext:. No deformities,  Neuro: no unilateral weakness, numbness, or tingling, no vision change or hearing loss Skin: No rashes, lesions or wounds. MSK: muscle aches  Heme: No easy bruising.  Travel history: No recent long distant travel.   Past Medical History:  Diagnosis Date  . Abnormal ECG 07/16/2012   echo--- LVH  . Chronic kidney disease, stage V  ([place]) 2018  . CRF (chronic renal failure) 2018  . H/O hyperkalemia   . Hypertension   . Kidney failure, acute ([place]) 2018    Past Surgical History:  Procedure Laterality Date  . HERNIA REPAIR    . ROTATOR CUFF REPAIR Right      reports that he has been smoking Cigars.  He has never used smokeless tobacco. He reports that he drinks alcohol. He reports that he does not use drugs.  No Known Allergies  Family History  Problem Relation Age of Onset  . Hyperlipidemia Father   . Hypertension Mother   . Breast cancer Mother   . CAD Neg Hx   . Diabetes Neg Hx   . Stroke Neg Hx   . Colon cancer Neg Hx   . Prostate cancer Neg Hx    Family history reviewed and non-pertinent  Prior to Admission medications   Medication Sig Start Date End Date Taking? Authorizing Provider  amLODipine (NORVASC) 10 MG tablet Take 1 tablet (10 mg total) by mouth daily. 06/12/16  Yes Colon Branch, MD  carvedilol (COREG) 25 MG tablet Take 1 tablet (25 mg total) by mouth 2 (two) times daily with a meal. 06/12/16  Yes Paz, Alda Berthold, MD  Multiple Vitamins-Minerals (CENTRUM SILVER PO) Take by mouth.   Yes [provider]  sodium polystyrene (KAYEXALATE) 15 GM/60ML suspension Take 60 mLs (15 g total) by mouth 3 (three) times daily. 06/23/16  Yes Colon Branch, MD    Physical Exam: Vitals:   06/23/16 1417 06/23/16 1600 06/23/16 1704  BP: (!) 158/100 Marland Kitchen)  157/109   Pulse: 61 60   Resp: 16 19   Temp: 98.5 F (36.9 C)    TempSrc: Oral    SpO2: 100% 100% 100%  Weight: 114 kg (251 lb 6 oz)    Height: 6\' 4"  (1.93 m)      Constitutional: NAD, calm, comfortable Eyes: PERRL, lids and conjunctivae normal ENMT: Mucous membranes are moist. Posterior pharynx clear of any exudate or lesions.  Neck: Normal, supple, no masses, no thyromegaly Respiratory: clear to auscultation bilaterally, no wheezing, no crackles. Normal respiratory effort.  Cardiovascular: Regular rate and rhythm, no murmurs / rubs / gallops. No  extremity edema. 2+ pedal pulses. Abdomen: no tenderness, no masses palpated. No hepatosplenomegaly. Bowel sounds positive.  Musculoskeletal: no clubbing / cyanosis. No joint deformity upper and lower extremities. Good ROM  Skin: no rashes, lesions, ulcers. No induration Neurologic: CN 2-12 grossly intact. Sensation intact, DTR normal. Strength 5/5 in all 4.  Psychiatric: Normal judgment and insight. Alert and oriented x 3. Normal mood.   Labs on Admission: I have personally reviewed following labs and imaging studies  CBC:  Recent Labs Lab 06/23/16 1531  WBC 4.6  NEUTROABS 2.3  HGB 10.3*  HCT 32.9*  MCV 85.2  PLT 297   Basic Metabolic Panel:  Recent Labs Lab 06/23/16 1531  NA 132*  K 7.1*  CL 101  CO2 25  GLUCOSE 82  BUN 51*  CREATININE 8.91*  CALCIUM 8.4*  MG 2.3  PHOS 4.2   GFR: Estimated Creatinine Clearance: 13.9 mL/min (A) (by C-G formula based on SCr of 8.91 mg/dL (H)). Liver Function Tests: No results for input(s): AST, ALT, ALKPHOS, BILITOT, PROT, ALBUMIN in the last 168 hours. No results for input(s): LIPASE, AMYLASE in the last 168 hours. No results for input(s): AMMONIA in the last 168 hours. Coagulation Profile: No results for input(s): INR, PROTIME in the last 168 hours. Cardiac Enzymes: No results for input(s): CKTOTAL, CKMB, CKMBINDEX, TROPONINI in the last 168 hours. BNP (last 3 results) No results for input(s): PROBNP in the last 8760 hours. HbA1C: No results for input(s): HGBA1C in the last 72 hours. CBG:  Recent Labs Lab 06/23/16 1650  GLUCAP 67   Lipid Profile: No results for input(s): CHOL, HDL, LDLCALC, TRIG, CHOLHDL, LDLDIRECT in the last 72 hours. Thyroid Function Tests: No results for input(s): TSH, T4TOTAL, FREET4, T3FREE, THYROIDAB in the last 72 hours. Anemia Panel: No results for input(s): VITAMINB12, FOLATE, FERRITIN, TIBC, IRON, RETICCTPCT in the last 72 hours. Urine analysis:    Component Value Date/Time   COLORURINE  STRAW (A) 06/23/2016 1618   APPEARANCEUR CLEAR 06/23/2016 1618   LABSPEC 1.003 (L) 06/23/2016 1618   PHURINE 7.0 06/23/2016 1618   GLUCOSEU NEGATIVE 06/23/2016 1618   HGBUR NEGATIVE 06/23/2016 1618   BILIRUBINUR NEGATIVE 06/23/2016 1618   KETONESUR NEGATIVE 06/23/2016 1618   PROTEINUR >=300 (A) 06/23/2016 1618   NITRITE NEGATIVE 06/23/2016 1618   LEUKOCYTESUR NEGATIVE 06/23/2016 1618   Radiological Exams on Admission: No results found.  EKG: I have reviewed EKG tracing independently  Assessment/Plan Acute kidney injury on CKD stage III - unclear etiology, may be progressive of CKD versus ATN Ad[facility] to [facility] for further renal evaluation Telemetry bed IV fluids We'll check urine lites, CK, renal ultrasound, SPEP and urine protein.  Nephrology consulted EDP Monitor BMP in AM  Hyperkalemia/Hyponatremia - in setting of acute renal failure  Received Kayexalate, Insulin, Ca gluconate, Dextrose and Albuterol  EKG no changes  Continue IVF, NS @  125  Check BMP @ 8 PM  HTN - stable  Continue Coreg and Norvasc  Monitor BP   Normocytic Anemia - unclear etiology at this time likely chronic diseases  Will send anemia panel  Check CBC  Will get FOBT  Hemodynamically stable   DVT prophylaxis: Heparin Code Status: Full code Family Communication: None at bedside Disposition Plan: Anticipate discharge to previous home environment.  Consults called: EDP consulted nephrology, Dr [name] Admission status: Inpatient/tele    Chipper Oman MD [facility] Pager: Text Page via [email or web address]  [phone]  If 7PM-7AM, please contact night-coverage [email or web address] Password [facility]  06/23/2016, 5:33 PM

## 2016-06-23 NOTE — ED Triage Notes (Signed)
Pt sent here by PCP for potassium of 7. Hx of kidney problems. Has felt lethargic at home.

## 2016-06-24 ENCOUNTER — Inpatient Hospital Stay (HOSPITAL_COMMUNITY): Payer: BLUE CROSS/BLUE SHIELD

## 2016-06-24 LAB — RENAL FUNCTION PANEL
Albumin: 3.1 g/dL — ABNORMAL LOW (ref 3.5–5.0)
Anion gap: 12 (ref 5–15)
BUN: 46 mg/dL — ABNORMAL HIGH (ref 6–20)
CO2: 24 mmol/L (ref 22–32)
Calcium: 8.7 mg/dL — ABNORMAL LOW (ref 8.9–10.3)
Chloride: 101 mmol/L (ref 101–111)
Creatinine, Ser: 8.71 mg/dL — ABNORMAL HIGH (ref 0.61–1.24)
GFR calc Af Amer: 7 mL/min — ABNORMAL LOW
GFR calc non Af Amer: 6 mL/min — ABNORMAL LOW
Glucose, Bld: 86 mg/dL (ref 65–99)
Phosphorus: 5.2 mg/dL — ABNORMAL HIGH (ref 2.5–4.6)
Potassium: 4.2 mmol/L (ref 3.5–5.1)
Sodium: 137 mmol/L (ref 135–145)

## 2016-06-24 LAB — CBG MONITORING, ED: GLUCOSE-CAPILLARY: 64 mg/dL — AB (ref 65–99)

## 2016-06-24 LAB — BASIC METABOLIC PANEL
ANION GAP: 8 (ref 5–15)
BUN: 51 mg/dL — ABNORMAL HIGH (ref 6–20)
CALCIUM: 8.5 mg/dL — AB (ref 8.9–10.3)
CO2: 25 mmol/L (ref 22–32)
CREATININE: 8.87 mg/dL — AB (ref 0.61–1.24)
Chloride: 105 mmol/L (ref 101–111)
GFR, EST AFRICAN AMERICAN: 7 mL/min — AB (ref >60)
GFR, EST NON AFRICAN AMERICAN: 6 mL/min — AB (ref >60)
Glucose, Bld: 83 mg/dL (ref 65–99)
Potassium: 4.7 mmol/L (ref 3.5–5.1)
SODIUM: 138 mmol/L (ref 135–145)

## 2016-06-24 LAB — GLUCOSE, CAPILLARY
Glucose-Capillary: 114 mg/dL — ABNORMAL HIGH (ref 65–99)
Glucose-Capillary: 88 mg/dL (ref 65–99)
Glucose-Capillary: 118 mg/dL — ABNORMAL HIGH (ref 65–99)

## 2016-06-24 LAB — HIV ANTIBODY (ROUTINE TESTING W REFLEX): HIV SCREEN 4TH GENERATION: NONREACTIVE

## 2016-06-24 LAB — CBC
HCT: 34.1 % — ABNORMAL LOW (ref 39.0–52.0)
HEMOGLOBIN: 10.8 g/dL — AB (ref 13.0–17.0)
MCH: 25.8 pg — ABNORMAL LOW (ref 26.0–34.0)
MCHC: 31.7 g/dL (ref 30.0–36.0)
MCV: 81.6 fL (ref 78.0–100.0)
PLATELETS: 215 10*3/uL (ref 150–400)
RBC: 4.18 MIL/uL — AB (ref 4.22–5.81)
RDW: 13.9 % (ref 11.5–15.5)
WBC: 4.7 10*3/uL (ref 4.0–10.5)

## 2016-06-24 LAB — TROPONIN I

## 2016-06-24 MED ORDER — FUROSEMIDE 10 MG/ML IJ SOLN
80.0000 mg | Freq: Two times a day (BID) | INTRAMUSCULAR | Status: DC
  Administered 2016-06-24 – 2016-06-25 (×3): 80 mg via INTRAVENOUS
  Filled 2016-06-24 (×4): qty 8

## 2016-06-24 MED ORDER — HYDRALAZINE HCL 20 MG/ML IJ SOLN
10.0000 mg | Freq: Four times a day (QID) | INTRAMUSCULAR | Status: DC | PRN
Start: 2016-06-24 — End: 2016-06-25

## 2016-06-24 MED ORDER — HYDRALAZINE HCL 25 MG PO TABS
25.0000 mg | ORAL_TABLET | Freq: Three times a day (TID) | ORAL | Status: DC
  Administered 2016-06-24: 25 mg via ORAL
  Filled 2016-06-24: qty 1

## 2016-06-24 MED ORDER — AMLODIPINE BESYLATE 10 MG PO TABS
10.0000 mg | ORAL_TABLET | Freq: Every day | ORAL | Status: DC
  Administered 2016-06-24 – 2016-06-25 (×2): 10 mg via ORAL
  Filled 2016-06-24 (×3): qty 1

## 2016-06-24 NOTE — Progress Notes (Signed)
PROGRESS NOTE  Chad Adkins.  FGH:[id number] DOB: 11/29/1965 DOA: 06/23/2016 PCP: Chad Branch, MD  Brief Narrative:  Chad Adkins. is a 51 y.o. male with medical history significant of hypertension and chronic kidney disease stage III presented to the emergency department after being seen by his PCP and found that he has a potassium level of 7.1 and a creatinine of 8.91.  Patient reported he saw his PCP a week ago and potassium was found to be 5.7 he was prescribed Kayexalate.  He tried to eat healthily by increasing his bananas and spinach but inadvertantly ate foods with high potassium.  He was called to come to the hospital for high potassium.  His potassium has trended down with lasix.  He continues to make copious urine.  He is being educated about potassium in foods.  If potassium remains stable, okay to discharge with outpatient follow up to start HD.    Assessment & Plan:   Active Problems:   AKI (acute kidney injury) ([place])  Acute kidney injury on CKD stage III - unclear etiology, may be progressive of CKD versus ATN -  Continue lasix -  Repeat BMP in AM  Hyperkalemia/Hyponatremia - in setting of acute renal failure, improved  Received Kayexalate, Insulin, Ca gluconate, Dextrose and Albuterol  -  Repeat BMP in AM  HTN - elevated Continue Coreg and Norvasc  Continue lasix Add hydralazine  Normocytic Anemia, likely due to renal disease.  Vitamin b12, folate, and TSH normal a few weeks ago.   -  Check ferritin  DVT prophylaxis:  heparin Code Status:  full Family Communication:  Patient alone Disposition Plan:  Likely home tomorrow if potassium remains stable   Consultants:   nephrology  Procedures:  none  Antimicrobials:  Anti-infectives    None       Subjective: Feeling well.  Denies shortness of breath, poor appetite, nausea.  Does feel fatigued.    Objective: Vitals:   06/23/16 2306 06/23/16 2339 06/24/16 0201 06/24/16 0534  BP: (!)  168/109 (!) 156/96 (!) 171/116 (!) 157/105  Pulse: (!) 54 66 62 (!) 58  Resp: 12 14 19 18   Temp:   98.2 F (36.8 C) 98 F (36.7 C)  TempSrc:      SpO2: 100% 100% 100% 100%  Weight:   110 kg (242 lb 8.1 oz)   Height:   6\' 4"  (1.93 m)     Intake/Output Summary (Last 24 hours) at 06/24/16 1514 Last data filed at 06/24/16 1100  Gross per 24 hour  Intake           605.83 ml  Output             1800 ml  Net         -1194.17 ml   Filed Weights   06/23/16 1417 06/24/16 0201  Weight: 114 kg (251 lb 6 oz) 110 kg (242 lb 8.1 oz)    Examination:  General exam:  Adult male.  No acute distress.  HEENT:  NCAT, MMM Respiratory system: Clear to auscultation bilaterally Cardiovascular system: Regular rate and rhythm, normal S1/S2. No murmurs, rubs, gallops or clicks.  Warm extremities Gastrointestinal system: Normal active bowel sounds, soft, nondistended, nontender. MSK:  Increased tone and bulk/muscular, no lower extremity edema Neuro:  Grossly intact    Data Reviewed: I have personally reviewed following labs and imaging studies  CBC:  Recent Labs Lab 06/23/16 1531 06/24/16 0807  WBC 4.6 4.7  NEUTROABS  2.3  --   HGB 10.3* 10.8*  HCT 32.9* 34.1*  MCV 85.2 81.6  PLT 244 517   Basic Metabolic Panel:  Recent Labs Lab 06/23/16 1531 06/23/16 2236 06/24/16 0807  NA 132* 138 137  K 7.1* 4.7 4.2  CL 101 105 101  CO2 25 25 24   GLUCOSE 82 83 86  BUN 51* 51* 46*  CREATININE 8.91* 8.87* 8.71*  CALCIUM 8.4* 8.5* 8.7*  MG 2.3  --   --   PHOS 4.2  --  5.2*   GFR: Estimated Creatinine Clearance: 13.9 mL/min (A) (by C-G formula based on SCr of 8.71 mg/dL (H)). Liver Function Tests:  Recent Labs Lab 06/24/16 0807  ALBUMIN 3.1*   No results for input(s): LIPASE, AMYLASE in the last 168 hours. No results for input(s): AMMONIA in the last 168 hours. Coagulation Profile: No results for input(s): INR, PROTIME in the last 168 hours. Cardiac Enzymes:  Recent Labs Lab  06/23/16 1531  CKTOTAL 365   BNP (last 3 results) No results for input(s): PROBNP in the last 8760 hours. HbA1C: No results for input(s): HGBA1C in the last 72 hours. CBG:  Recent Labs Lab 06/23/16 1650 06/23/16 1849 06/24/16 0059 06/24/16 0152 06/24/16 0350  GLUCAP 67 63* 64* 114* 88   Lipid Profile: No results for input(s): CHOL, HDL, LDLCALC, TRIG, CHOLHDL, LDLDIRECT in the last 72 hours. Thyroid Function Tests: No results for input(s): TSH, T4TOTAL, FREET4, T3FREE, THYROIDAB in the last 72 hours. Anemia Panel: No results for input(s): VITAMINB12, FOLATE, FERRITIN, TIBC, IRON, RETICCTPCT in the last 72 hours. Urine analysis:    Component Value Date/Time   COLORURINE STRAW (A) 06/23/2016 1618   APPEARANCEUR CLEAR 06/23/2016 1618   LABSPEC 1.003 (L) 06/23/2016 1618   PHURINE 7.0 06/23/2016 1618   GLUCOSEU NEGATIVE 06/23/2016 1618   HGBUR NEGATIVE 06/23/2016 1618   BILIRUBINUR NEGATIVE 06/23/2016 1618   KETONESUR NEGATIVE 06/23/2016 1618   PROTEINUR >=300 (A) 06/23/2016 1618   NITRITE NEGATIVE 06/23/2016 1618   LEUKOCYTESUR NEGATIVE 06/23/2016 1618   Sepsis Labs: @LABRCNTIP (procalcitonin:4,lacticidven:4)  )No results found for this or any previous visit (from the past 240 hour(s)).    Radiology Studies: US Renal  Result Date: 06/23/2016 CLINICAL DATA:  51 year old male with history of acute kidney injury. EXAM: RENAL / URINARY TRACT ULTRASOUND COMPLETE COMPARISON:  Abdominal ultrasound 06/15/2016. FINDINGS: Right Kidney: Length: 11.2 cm. Diffuse increased echogenicity throughout the renal parenchyma. Anechoic lesions with increased through transmission are again noted in the right kidney measuring up to 3.3 x 3.4 x 3.1 cm in the interpolar region, compatible with simple cysts. The all No hydronephrosis visualized. Left Kidney: Length: 10.6 cm. Diffuse increased echogenicity throughout the renal parenchyma. No mass or hydronephrosis visualized. Bladder: Appears normal  for degree of bladder distention. IMPRESSION: 1. Diffuse increased echogenicity throughout both kidneys, compatible with underlying medical renal disease. 2. No hydronephrosis. 3. Simple cysts in the right kidney again evident. Electronically Signed   By: Vinnie Langton M.D.   On: 06/23/2016 18:49   Dg Chest Port 1 View  Result Date: 06/23/2016 CLINICAL DATA:  Of acute kidney injury.  Hypertension. EXAM: PORTABLE CHEST 1 VIEW COMPARISON:  None. FINDINGS: The heart size and mediastinal contours are within normal limits. Both lungs are clear. The visualized skeletal structures are unremarkable. IMPRESSION: No active disease. Electronically Signed   By: Kerby Moors M.D.   On: 06/23/2016 19:43     Scheduled Meds: . amLODipine  10 mg Oral Daily  . carvedilol  25 mg Oral BID WC  . furosemide  80 mg Intravenous BID  . heparin  5,000 Units Subcutaneous Q8H   Continuous Infusions:   LOS: 1 day    Time spent: 30 min    Janece Canterbury, MD [facility] Pager [phone]  If 7PM-7AM, please contact night-coverage [email or web address] Password [email or web address] 06/24/2016, 3:14 PM

## 2016-06-24 NOTE — Plan of Care (Signed)
Problem: Food- and Nutrition-Related Knowledge Deficit (NB-1.1) Goal: Nutrition education Formal process to instruct or train a patient/client in a skill or to impart knowledge to help patients/clients voluntarily manage or modify food choices and eating behavior to maintain or improve health. Outcome: Completed/Met Date Met: 06/24/16 Nutrition Education Note  RD consulted for Renal Education, specifically potassium and sodium.  Pt had very elevated K at 7.2.   Patient has only just recently been found to have advanced kidney disease and, as a result, is still learning a lot about what foods he should choose vs avoid. He is frustrated today because he had thought he had been doing the best thing he could by eating high amounts of vegetables, fruits, juices etc. He was unaware that he was consuming high K containing foods in large quantities.   Dietary habits identified that led to severe hyperkalemia include: -eating avocados frequently -snacking on cherry tomatoes -eating pears throughout the day -Going to a Juice bar for many of his meals. His juice included leafy greens, corn, and fruits -Eating many salads. He showed RD pictured of one of his meals on his phone, >25% of the plate was raw spinach, 25% of plate was corn -drinking organic, natural, non gmo health shakes from health stores-likely very high in K -taking daily mvi that contains k (recommended to stop) -Eating frozen dinners with spinach and other leafy greens  Pt says he just had a very poor understanding of which foods were OK to consume. 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 He says he will go home and clean out his fridge and put appropriate items in it, just as long as he knows what he should and shouldn't eat.   Provided Choose-A-Meal Booklet and copy of the Renal Pyramid to patient   Reviewed food groups  and provided written recommended serving sizes.   Specific dietary recommendations made were the cessation of eating leafy greens, avoiding the juice bar, stop snacking on avocados/pears/cherry tomatoes  Explained why diet restrictions are needed and provided lists of foods to limit/avoid that are high potassium, sodium, and phosphorus.   Given that he is not on dialysis yet, recommended eating protein at each meal (3-4oz), but being careful not to over consume.   Encouraged pt to discuss specific diet questions/concerns with RD at HD outpatient facility.   Expect Excellent compliance. Pt asked appropriate questions and is very invested in his own health. He had just not been told what foods he should consume vs avoid  Body mass index is 29.52 kg/m. Pt meets criteria for overweight based on current BMI.  Current diet order is NPO. Labs and medications reviewed. No further nutrition interventions warranted at this time.  If additional nutrition issues arise, please re-consult RD.  Burtis Junes RD, LDN, CNSC Clinical Nutrition Pager: [phone] 06/24/2016 10:47 AM

## 2016-06-24 NOTE — Progress Notes (Signed)
Called to check status of patient transfer from [facility] ED to [facility] at [facility].  Per [facility], waiting on [facility] for transfer.  Room is ready.  Earleen Reaper RN-BC, [employer]

## 2016-06-24 NOTE — Significant Event (Signed)
Rapid Response Event Note  Overview: Time Called: 2008 Arrival Time: 2011 Event Type: Neurologic  Initial Focused Assessment: Syncopal episode   Interventions: Orthostatic BP's  Plan of Care (if not transferred):  Event Summary: Reported patient had 2 syncopal episode. No reported injury at this time. Patient reportedly passed out in the restroom and during orthostatic BP's. Appears alert and oriented. Skin is warm and dry. Heart sounds regular but bradycardic at rate of 57. Lungs clear bilateral. Neuro intact. CBG is 118. BP was 135/79. On-call MD was made aware of event. No RRT interventions needed at this time. Recommended bedrest until BP's normalize.     Chad Adkins [place]

## 2016-06-24 NOTE — Progress Notes (Signed)
New Admission Note:   Arrival Method:   Via stretcher from ED Mental Orientation:  A & O x 4 Telemetry: Placed on Tele #[id number] Assessment: Completed Skin:  Tattoos and Keloids but otherwise intact IV:  Left AC NSL Pain: Denies Tubes: None Safety Measures: Safety Fall Prevention Plan has been given, discussed and signed Admission: Completed [address] [place] Orientation: Patient has been orientated to the room, unit and staff.  Family:  From home alone but none at bedside  Orders have been reviewed and implemented. Will continue to monitor the patient. Call light has been placed within reach and bed alarm has been activated.   Earleen Reaper RN- [place], [place] Phone number: [phone]

## 2016-06-24 NOTE — Progress Notes (Signed)
Admit: 06/23/2016 LOS: 1  89M with progressive CKD and Hyperkalemia  Subjective:  No new events overnight Patient without further questions Potassium has normalized and this morning potassium remains normal   05/04 0701 - 05/05 0700 In: 605.8 [I.V.:495.8; IV Piggyback:110] Out: 800 [Urine:800]  Filed Weights   06/23/16 1417 06/24/16 0201  Weight: 114 kg (251 lb 6 oz) 110 kg (242 lb 8.1 oz)    Scheduled Meds: . amLODipine  10 mg Oral Daily  . carvedilol  25 mg Oral BID WC  . furosemide  80 mg Intravenous BID  . heparin  5,000 Units Subcutaneous Q8H   Continuous Infusions: PRN Meds:.bisacodyl, hydrALAZINE, ondansetron **OR** ondansetron (ZOFRAN) IV  Current Labs: reviewed    Physical Exam:  Blood pressure (!) 157/105, pulse (!) 58, temperature 98 F (36.7 C), resp. rate 18, height 6\' 4"  (1.93 m), weight 110 kg (242 lb 8.1 oz), SpO2 100 %. GEN: no acute distress, above average muscle tone/bulk ENT: NCAT EYES: EOMI CV: RRR, no murmur rub or gallop. Normal S1 and S2 PULM: clear bilaterally, normal work of breathing, speaks in full sentences unlabored ABD: soft, nontender, nondistended, no abdominal bruits SKIN: no rashes or lesions EXT:no edema NEURO: Nonfocal, alert and oriented  A 1. 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 Hyperkalemia, > 7, w/o EKG changes related to #1 and high diteary K intake 3. Anemia, normocytic; w/u sent by primary service 4. Mild 2HPTH (Based on outpt labs) with nl P and Ca  P 1. I do not think he needs dialysis given improvement in serum potassium and explanation of dietary potassium being the medially precipitating cause 2. I have provided paperwork about low potassium and low salt diet hopefully nutritionist can see 3. Continue amlodipine, carvedilol, and twice a day Lasix; next antihypertensive would be hydralazine 4. If labs remain stable should be safe for discharge in the morning with close follow-up with Dr. [name] for  renal replacement therapy planning 5. Daily weights, Daily Renal Panel, Strict I/Os, Avoid nephrotoxins (NSAIDs, judicious IV Contrast)    Pearson Grippe MD 06/24/2016, 10:11 AM   Recent Labs Lab 06/23/16 1531 06/23/16 2236 06/24/16 0807  NA 132* 138 137  K 7.1* 4.7 4.2  CL 101 105 101  CO2 25 25 24   GLUCOSE 82 83 86  BUN 51* 51* 46*  CREATININE 8.91* 8.87* 8.71*  CALCIUM 8.4* 8.5* 8.7*  PHOS 4.2  --  5.2*    Recent Labs Lab 06/23/16 1531 06/24/16 0807  WBC 4.6 4.7  NEUTROABS 2.3  --   HGB 10.3* 10.8*  HCT 32.9* 34.1*  MCV 85.2 81.6  PLT 244 215

## 2016-06-24 NOTE — ED Notes (Signed)
Unable to reach hospitalist to obtain order for dextrose as pt CBG is 64 and pt is NPO. Pt is stable and asymptomatic. [facility] aware and following up w/ blood sugar management.

## 2016-06-25 ENCOUNTER — Inpatient Hospital Stay (HOSPITAL_COMMUNITY): Payer: BLUE CROSS/BLUE SHIELD

## 2016-06-25 ENCOUNTER — Other Ambulatory Visit (HOSPITAL_COMMUNITY): Payer: BLUE CROSS/BLUE SHIELD

## 2016-06-25 DIAGNOSIS — R55 Syncope and collapse: Secondary | ICD-10-CM

## 2016-06-25 DIAGNOSIS — I951 Orthostatic hypotension: Secondary | ICD-10-CM

## 2016-06-25 LAB — BASIC METABOLIC PANEL
ANION GAP: 13 (ref 5–15)
BUN: 50 mg/dL — ABNORMAL HIGH (ref 6–20)
CHLORIDE: 103 mmol/L (ref 101–111)
CO2: 23 mmol/L (ref 22–32)
Calcium: 8.3 mg/dL — ABNORMAL LOW (ref 8.9–10.3)
Creatinine, Ser: 9.6 mg/dL — ABNORMAL HIGH (ref 0.61–1.24)
GFR calc non Af Amer: 6 mL/min — ABNORMAL LOW (ref >60)
GFR, EST AFRICAN AMERICAN: 7 mL/min — AB (ref >60)
Glucose, Bld: 129 mg/dL — ABNORMAL HIGH (ref 65–99)
POTASSIUM: 3.9 mmol/L (ref 3.5–5.1)
Sodium: 139 mmol/L (ref 135–145)

## 2016-06-25 LAB — TROPONIN I

## 2016-06-25 LAB — CBC
HEMATOCRIT: 33.5 % — AB (ref 39.0–52.0)
HEMOGLOBIN: 10.9 g/dL — AB (ref 13.0–17.0)
MCH: 26.5 pg (ref 26.0–34.0)
MCHC: 32.5 g/dL (ref 30.0–36.0)
MCV: 81.3 fL (ref 78.0–100.0)
Platelets: 227 10*3/uL (ref 150–400)
RBC: 4.12 MIL/uL — AB (ref 4.22–5.81)
RDW: 13.8 % (ref 11.5–15.5)
WBC: 5.3 10*3/uL (ref 4.0–10.5)

## 2016-06-25 MED ORDER — ASPIRIN EC 325 MG PO TBEC
325.0000 mg | DELAYED_RELEASE_TABLET | Freq: Every day | ORAL | Status: DC
  Administered 2016-06-25: 325 mg via ORAL
  Filled 2016-06-25: qty 1

## 2016-06-25 MED ORDER — SODIUM CHLORIDE 0.9 % IV BOLUS (SEPSIS)
1000.0000 mL | Freq: Once | INTRAVENOUS | Status: AC
  Administered 2016-06-25: 1000 mL via INTRAVENOUS

## 2016-06-25 MED ORDER — CARVEDILOL 12.5 MG PO TABS
12.5000 mg | ORAL_TABLET | Freq: Two times a day (BID) | ORAL | Status: DC
  Administered 2016-06-25: 12.5 mg via ORAL
  Filled 2016-06-25: qty 1

## 2016-06-25 NOTE — Progress Notes (Signed)
   06/25/16 0417  What Happened  Was fall witnessed? (no)  Was patient injured? Unsure  Patient found other (Comment) (Fell in bathroom - got self back to bed )  Found by No one-pt stated they fell  Stated prior activity bathroom-unassisted  Follow Up  MD notified Dr. Jani Gravel  Time MD notified [phone]  Family notified No- patient refusal  Additional tests Yes-comment (Troponin x3, Ct Head, MRI Brain, Echo, Carotid Dopplers, EKG)  Progress note created (see row info) Yes  Adult Fall Risk Assessment  Risk Factor Category (scoring not indicated) Fall has occurred during this admission (document High fall risk)  Patient's Fall Risk High Fall Risk (>13 points)  Adult Fall Risk Interventions  Required Bundle Interventions *See Row Information* High fall risk - low, moderate, and high requirements implemented  Additional Interventions Use of appropriate toileting equipment (bedpan, BSC, etc.)  Screening for Fall Injury Risk  Risk For Fall Injury- See Row Information  None identified  Vitals  Temp 98.1 F (36.7 C)  Temp Source Oral  BP (!) 146/89  BP Location Right Arm  BP Method Automatic  Patient Position (if appropriate) Lying  Pulse Rate (!) 57  Pulse Rate Source Dinamap  Resp 18  Oxygen Therapy  SpO2 99 %  O2 Device Room Air

## 2016-06-25 NOTE — Progress Notes (Signed)
At approximately 2000, patient used call bell to tell RN that he was dizzy.  RN answered call bell and advised patient to stay in bed.  NT asked to obtain VS.  Upon entry into room, patient is lying in the bed.  He states he fell in the bathroom.  Upon further assessment, he stated he had to urinate.  He dangled legs on side of bed and was not dizzy.  He then paced around near bed to help with his leg cramps.  He still was not dizzy.  While in the bathroom, he felt dizzy and "passed out".  He woke up on bathroom floor and got self back to bed.  At that time, he called for help.  Upon trying to get orthostatic VS, patient had a witnessed syncopal episode while sitting on the side of the bed.  Patient assisted to recline on bed and immediately regained consciousness.  Post Fall Interventions implemented.  MD notified and orders received and implemented.  Rapid Response RN called and came to be to assess patient.  Neurological exam WNL except for dizziness when attempting to sit up.  Education enforced with patient that he is currently on bedrest.  Urinal and call bell at bedside.  Bed Alarm activated.  Will continue to monitor patient.  [employer] RN-BC, WTA.

## 2016-06-25 NOTE — Progress Notes (Signed)
Admit: 06/23/2016 LOS: 2  17M with progressive CKD and Hyperkalemia  Subjective:  2 episodes of syncope overnight, each related to positional changes Labs this morning pending Orthostatic this morning pending Good diuresis with parenteral Lasix   05/05 0701 - 05/06 0700 In: 960 [P.O.:960] Out: 1650 [Urine:1650]  Filed Weights   06/23/16 1417 06/24/16 0201  Weight: 114 kg (251 lb 6 oz) 110 kg (242 lb 8.1 oz)    Scheduled Meds: . amLODipine  10 mg Oral Daily  . aspirin EC  325 mg Oral Daily  . carvedilol  12.5 mg Oral BID WC  . furosemide  80 mg Intravenous BID   Continuous Infusions: PRN Meds:.bisacodyl, hydrALAZINE, ondansetron **OR** ondansetron (ZOFRAN) IV  Current Labs: reviewed    Physical Exam:  Blood pressure (!) 146/89, pulse (!) 57, temperature 98.1 F (36.7 C), temperature source Oral, resp. rate 18, height 6\' 4"  (1.93 m), weight 110 kg (242 lb 8.1 oz), SpO2 99 %. GEN: no acute distress, above average muscle tone/bulk ENT: NCAT EYES: EOMI CV: RRR, no murmur rub or gallop. Normal S1 and S2 PULM: clear bilaterally, normal work of breathing, speaks in full sentences unlabored ABD: soft, nontender, nondistended, no abdominal bruits SKIN: no rashes or lesions EXT:no edema NEURO: Nonfocal, alert and oriented  A 1. 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 Hyperkalemia, > 7, w/o EKG changes related to #1 and high diteary K intake 3. Anemia, normocytic; w/u sent by primary service, > 10 4. Mild 2HPTH (Based on outpt labs) with nl P and Ca 5. Orthostatic Syncope in hospital  P 1. Await morning labs but unlikely to need dialysis immediately but needs to continue close follow-up with Dr. [name] 2. Encouraged attending kidney options training/education on 5/11 3. Would send out on Lasix, 80 mg by mouth twice daily 4. Will arrange follow-up labs on 5/8 or 5/9 through our office 5. If orthostatics are negative, patient without symptoms, and renal  function stable with normal potassium he is safe for discharge   Pearson Grippe MD 06/25/2016, 8:59 AM   Recent Labs Lab 06/23/16 1531 06/23/16 2236 06/24/16 0807  NA 132* 138 137  K 7.1* 4.7 4.2  CL 101 105 101  CO2 25 25 24   GLUCOSE 82 83 86  BUN 51* 51* 46*  CREATININE 8.91* 8.87* 8.71*  CALCIUM 8.4* 8.5* 8.7*  PHOS 4.2  --  5.2*    Recent Labs Lab 06/23/16 1531 06/24/16 0807  WBC 4.6 4.7  NEUTROABS 2.3  --   HGB 10.3* 10.8*  HCT 32.9* 34.1*  MCV 85.2 81.6  PLT 244 215

## 2016-06-25 NOTE — Discharge Summary (Signed)
Physician Discharge Summary  Chad Adkins. TJQ:[id number] DOB: August 20, 1965 DOA: 06/23/2016  PCP: Colon Branch, MD  Admit date: 06/23/2016 Discharge date: 06/25/2016  Admitted From: home  Disposition:  home  Recommendations for Outpatient Follow-up:  1. Attending kidney options training/education on 5/11 2. Follow-up labs on 5/8 or 5/9 through [facility] 3. Close follow-up with Dr. [name] to discuss transplant and setting up dialysis  Home Health:  none  Equipment/Devices:  None  Discharge Condition:  Stable, improved CODE STATUS:  Full code  Diet recommendation:  Renal   Brief/Interim Summary:  Chad Adkinsis a 51 y.o.malewith medical history significant ofhypertension and chronic kidney disease stage III presented to the emergency department after being seen by his PCP and found that he has a potassium level of 7.1and a creatinine of 8.91.  Patient reported he saw his PCP a week ago and potassium was found to be 5.7 he was prescribed Kayexalate.  He tried to eat healthily by increasing his bananas and spinach but inadvertantly ate foods with high potassium.  He was called to come to the hospital for high potassium.  His potassium trended down from 7.1 to 3.9 with lasix and low potassium diet.  He continued to make copious urine.  He was educated about potassium in foods.  Overnight from 5/5 to 5/6, he had two syncopal episodes.  The first occurred during micturition.  It was unwitnessed and he may have hit his head.  The second episode occurred about 10-minutes later after he sat up in bed.  CT demonstrated a possible basal ganglion infarct, but follow up MRI demonstrated no evidence of stroke.  CT findings were determined to be erroneous.  Orthostatic vital signs were positive (HR increased from 60s to 100s with standing), attributed to IV lasix he had been receiving for his hyperkalemia.  He was given IVF and his orthostatics improved.    Discharge Diagnoses:   Active Problems:   AKI (acute kidney injury) ([place])   Progressive CKD5 with hyperkalemia and hyponatremia, partly due to high potassium diet - Received Kayexalate, Insulin, Ca gluconate, Dextrose and Albuterol  - Started lasix per nephrology  - Nutrition offered dietary advice regarding low potassium diet -  Potassium levels continued to trend down -  He did not have uremic symptoms -  Nephrology felt that he could follow up as an outpatient for work up for dialysis and possible transplant -  Intially had planned to discharged with oral lasix, however, patient developed syncope/orthostatic hypotension from diuretics, so no lasix was prescribed at discharge.   Syncope, occurred after he stool up and walked to the bathroom during micturition the first time.  He had prodrome of flushing/lightheadedness but no chest pains or palpitations.  He woke up on the floor.  The second episode occurred after he sat up in bed. -  Low suspicion for PE given normal oxygenation and HR -  No stroke on MRI -  Troponins negative and ECG stable from prior (T-wave abnl in lateral leads were there on old ECG from a few years ago) -  Tele:  NSR -  Orthostatics initially positive, were negative after IV fluids -  Carotids were done before they could be canceled and demonstarted 1-39% stenosis -  ECHO initially ordered but discontinued due to clear history and positive orthostatics  HTN - elevated Continued Coreg and Norvasc   Normocytic Anemia, likely due to renal disease.  Vitamin b12, folate, and TSH normal a few weeks  ago.   -  defer to nephrology   Discharge Instructions  Discharge Instructions    Call MD for:  difficulty breathing, headache or visual disturbances    Complete by:  As directed    Call MD for:  extreme fatigue    Complete by:  As directed    Call MD for:  hives    Complete by:  As directed    Call MD for:  persistant dizziness or light-headedness    Complete by:  As directed     Call MD for:  persistant nausea and vomiting    Complete by:  As directed    Call MD for:  severe uncontrolled pain    Complete by:  As directed    Call MD for:  temperature >100.4    Complete by:  As directed    Diet - low sodium heart healthy    Complete by:  As directed    Increase activity slowly    Complete by:  As directed        Medication List    STOP taking these medications   CENTRUM SILVER PO   sodium polystyrene 15 GM/60ML suspension Commonly known as:  KAYEXALATE     TAKE these medications   amLODipine 10 MG tablet Commonly known as:  NORVASC Take 1 tablet (10 mg total) by mouth daily.   carvedilol 25 MG tablet Commonly known as:  COREG Take 1 tablet (25 mg total) by mouth 2 (two) times daily with a meal.      Follow-up Information    Colon Branch, MD Follow up.   Specialty:  Internal Medicine Why:  as needed Contact information: [address] [place] [address] [phone]        [name], MD. Schedule an appointment as soon as possible for a visit in 1 week(s).   Specialty:  Nephrology Why:  you will also need bloodwork at their office in a couple days. Contact information: [place] [address] [phone]          No Known Allergies  Consultations: Nephrology   Procedures/Studies: Ct Head Wo Contrast  Result Date: 06/25/2016 CLINICAL DATA:  Syncope. History of hypertension, chronic renal failure and hyperlipidemia. EXAM: CT HEAD WITHOUT CONTRAST TECHNIQUE: Contiguous axial images were obtained from the base of the skull through the vertex without intravenous contrast. COMPARISON:  MRI of the head August 27, 2012 FINDINGS: BRAIN: No intraparenchymal hemorrhage, mass effect nor midline shift. The ventricles and sulci are normal. 4 mm hypodensity RIGHT putamen (axial 17/32) new from prior MRI, not well characterized on reformations. No acute large vascular territory infarcts. No abnormal extra-axial fluid  collections. Basal cisterns are patent. VASCULAR: Unremarkable. SKULL/SOFT TISSUES: No skull fracture. No significant soft tissue swelling. ORBITS/SINUSES: The included ocular globes and orbital contents are normal.Trace paranasal sinus mucosal thickening without air-fluid levels. Well-aerated mastoid air cells. Pneumatized features apices. OTHER: None. IMPRESSION: 4 mm age-indeterminate RIGHT basal ganglia lacunar infarct versus artifact. Otherwise negative noncontrast CT HEAD. Electronically Signed   By: Elon Alas M.D.   On: 06/25/2016 00:03   Mr Brain Wo Contrast  Result Date: 06/25/2016 CLINICAL DATA:  Syncope, follow-up abnormal CT. History of hypertension, kidney failure and hyperlipidemia. EXAM: MRI HEAD WITHOUT CONTRAST TECHNIQUE: Multiplanar, multiecho pulse sequences of the brain and surrounding structures were obtained without intravenous contrast. COMPARISON:  CT HEAD Jun 24, 2016 and MRI of the head August 27, 2012 FINDINGS: BRAIN: No reduced  diffusion to suggest acute ischemia. No susceptibility artifact to suggest hemorrhage. The ventricles and sulci are normal for patient's age. No suspicious parenchymal signal, masses or mass effect. No abnormal extra-axial fluid collections. VASCULAR: Normal major intracranial vascular flow voids present at skull base. SKULL AND UPPER CERVICAL SPINE: No abnormal sellar expansion. No suspicious calvarial bone marrow signal. Craniocervical junction maintained. Severe RIGHT temporomandibular osteoarthrosis. SINUSES/ORBITS: The mastoid air-cells and included paranasal sinuses are well-aerated. The included ocular globes and orbital contents are non-suspicious. OTHER: None. IMPRESSION: Normal noncontrast MRI head ; CT finding was artifact. Electronically Signed   By: Elon Alas M.D.   On: 06/25/2016 04:22   US Renal  Result Date: 06/23/2016 CLINICAL DATA:  51 year old male with history of acute kidney injury. EXAM: RENAL / URINARY TRACT ULTRASOUND  COMPLETE COMPARISON:  Abdominal ultrasound 06/15/2016. FINDINGS: Right Kidney: Length: 11.2 cm. Diffuse increased echogenicity throughout the renal parenchyma. Anechoic lesions with increased through transmission are again noted in the right kidney measuring up to 3.3 x 3.4 x 3.1 cm in the interpolar region, compatible with simple cysts. The all No hydronephrosis visualized. Left Kidney: Length: 10.6 cm. Diffuse increased echogenicity throughout the renal parenchyma. No mass or hydronephrosis visualized. Bladder: Appears normal for degree of bladder distention. IMPRESSION: 1. Diffuse increased echogenicity throughout both kidneys, compatible with underlying medical renal disease. 2. No hydronephrosis. 3. Simple cysts in the right kidney again evident. Electronically Signed   By: Vinnie Langton M.D.   On: 06/23/2016 18:49   US Renal  Result Date: 06/15/2016 CLINICAL DATA:  Elevated creatinine EXAM: RENAL / URINARY TRACT ULTRASOUND COMPLETE COMPARISON:  08/02/2012 FINDINGS: Right Kidney: Length: 10.9 cm. 3.7 cm simple appearing cyst in the midpole with adjacent 1 cm cyst. Diffusely increased echotexture throughout the right kidney. No hydronephrosis. Left Kidney: Length: 10.0 cm. Diffusely increased echotexture throughout the left kidney. No hydronephrosis. Bladder: Appears normal for degree of bladder distention. IMPRESSION: Increased echotexture within the kidneys bilaterally compatible chronic medical renal disease. Benign appearing right renal cysts. No acute findings. Electronically Signed   By: Rolm Baptise M.D.   On: 06/15/2016 10:28   Dg Chest Port 1 View  Result Date: 06/23/2016 CLINICAL DATA:  Of acute kidney injury.  Hypertension. EXAM: PORTABLE CHEST 1 VIEW COMPARISON:  None. FINDINGS: The heart size and mediastinal contours are within normal limits. Both lungs are clear. The visualized skeletal structures are unremarkable. IMPRESSION: No active disease. Electronically Signed   By: Kerby Moors  M.D.   On: 06/23/2016 19:43    Subjective:  Feeling much better today and would still like to be able to go home.  Feels clear about his low potassium diet.  Denies chest pains, palpitations.  No focal weakness, numbness, tingling, or slurred speech.  No bowel incontinence or post-ictal state after syncopal events last night.    Discharge Exam: Vitals:   06/25/16 0903 06/25/16 0906  BP: (!) 129/54 138/83  Pulse: 89 (!) 106  Resp:    Temp:     Vitals:   06/25/16 0417 06/25/16 0900 06/25/16 0903 06/25/16 0906  BP: (!) 146/89 (!) 151/84 (!) 129/54 138/83  Pulse: (!) 57 68 89 (!) 106  Resp: 18     Temp: 98.1 F (36.7 C)     TempSrc: Oral     SpO2: 99%     Weight:      Height:        General exam:  Adult male.  No acute distress.  HEENT:  NCAT, MMM  Respiratory system: Clear to auscultation bilaterally Cardiovascular system: Regular rate and rhythm, normal S1/S2. No murmurs, rubs, gallops or clicks.  Warm extremities Gastrointestinal system: Normal active bowel sounds, soft, nondistended, nontender. MSK:  Increased tone and bulk/muscular, no lower extremity edema Neuro:  no facial droop, 5/5 strength throughout, sensation intact to light touch    The results of significant diagnostics from this hospitalization (including imaging, microbiology, ancillary and laboratory) are listed below for reference.     Microbiology: No results found for this or any previous visit (from the past 240 hour(s)).   Labs: BNP (last 3 results) No results for input(s): BNP in the last 8760 hours. Basic Metabolic Panel:  Recent Labs Lab 06/23/16 1531 06/23/16 2236 06/24/16 0807 06/25/16 0829  NA 132* 138 137 139  K 7.1* 4.7 4.2 3.9  CL 101 105 101 103  CO2 25 25 24 23   GLUCOSE 82 83 86 129*  BUN 51* 51* 46* 50*  CREATININE 8.91* 8.87* 8.71* 9.60*  CALCIUM 8.4* 8.5* 8.7* 8.3*  MG 2.3  --   --   --   PHOS 4.2  --  5.2*  --    Liver Function Tests:  Recent Labs Lab 06/24/16 0807   ALBUMIN 3.1*   No results for input(s): LIPASE, AMYLASE in the last 168 hours. No results for input(s): AMMONIA in the last 168 hours. CBC:  Recent Labs Lab 06/23/16 1531 06/24/16 0807 06/25/16 0829  WBC 4.6 4.7 5.3  NEUTROABS 2.3  --   --   HGB 10.3* 10.8* 10.9*  HCT 32.9* 34.1* 33.5*  MCV 85.2 81.6 81.3  PLT 244 215 227   Cardiac Enzymes:  Recent Labs Lab 06/23/16 1531 06/24/16 2107 06/25/16 0157 06/25/16 0829  CKTOTAL 365  --   --   --   TROPONINI  --  <0.03 <0.03 <0.03   BNP: Invalid input(s): POCBNP CBG:  Recent Labs Lab 06/23/16 1849 06/24/16 0059 06/24/16 0152 06/24/16 0350 06/24/16 2041  GLUCAP 63* 64* 114* 88 118*   D-Dimer No results for input(s): DDIMER in the last 72 hours. Hgb A1c No results for input(s): HGBA1C in the last 72 hours. Lipid Profile No results for input(s): CHOL, HDL, LDLCALC, TRIG, CHOLHDL, LDLDIRECT in the last 72 hours. Thyroid function studies No results for input(s): TSH, T4TOTAL, T3FREE, THYROIDAB in the last 72 hours.  Invalid input(s): FREET3 Anemia work up No results for input(s): VITAMINB12, FOLATE, FERRITIN, TIBC, IRON, RETICCTPCT in the last 72 hours. Urinalysis    Component Value Date/Time   COLORURINE STRAW (A) 06/23/2016 1618   APPEARANCEUR CLEAR 06/23/2016 1618   LABSPEC 1.003 (L) 06/23/2016 1618   PHURINE 7.0 06/23/2016 1618   GLUCOSEU NEGATIVE 06/23/2016 1618   HGBUR NEGATIVE 06/23/2016 1618   BILIRUBINUR NEGATIVE 06/23/2016 1618   KETONESUR NEGATIVE 06/23/2016 1618   PROTEINUR >=300 (A) 06/23/2016 1618   NITRITE NEGATIVE 06/23/2016 1618   LEUKOCYTESUR NEGATIVE 06/23/2016 1618   Sepsis Labs Invalid input(s): PROCALCITONIN,  WBC,  LACTICIDVEN   Time coordinating discharge: Over 30 minutes  SIGNED:   Janece Canterbury, MD  [facility] 06/25/2016, 4:38 PM Pager   If 7PM-7AM, please contact night-coverage [email or web address] Password [email or web address]

## 2016-06-25 NOTE — Progress Notes (Signed)
*  PRELIMINARY RESULTS* Vascular Ultrasound Carotid Duplex (Doppler) has been completed.   Findings suggest 1-39% internal carotid artery stenosis bilaterally. Vertebral arteries are patent with antegrade flow.  06/25/2016 3:15 PM Maudry Mayhew, BS, RVT, RDCS, RDMS

## 2016-06-25 NOTE — Progress Notes (Signed)
Patient discharge teaching given, including activity, diet, follow-up appoints, and medications. Patient verbalized understanding of all discharge instructions. IV access was d/c'd. Vitals are stable. Skin is intact except as charted in most recent assessments. Pt to be escorted out by NT, to be driven home by family. 

## 2016-06-26 ENCOUNTER — Telehealth: Payer: Self-pay | Admitting: Behavioral Health

## 2016-06-26 LAB — PROTEIN ELECTROPHORESIS, SERUM
A/G RATIO SPE: 0.9 (ref 0.7–1.7)
ALPHA-2-GLOBULIN: 0.7 g/dL (ref 0.4–1.0)
Albumin ELP: 3 g/dL (ref 2.9–4.4)
Alpha-1-Globulin: 0.2 g/dL (ref 0.0–0.4)
Beta Globulin: 1.3 g/dL (ref 0.7–1.3)
GLOBULIN, TOTAL: 3.3 g/dL (ref 2.2–3.9)
Gamma Globulin: 1.1 g/dL (ref 0.4–1.8)
TOTAL PROTEIN ELP: 6.3 g/dL (ref 6.0–8.5)

## 2016-06-26 NOTE — Telephone Encounter (Signed)
Appointment has been scheduled for a hospital follow-up visit with PCP on 07/04/16 at 11:30 AM.

## 2016-06-27 LAB — IFE+PROTEIN ELECTRO, 24-HR UR
% BETA, URINE: 11.3 %
ALBUMIN, U: 70.3 %
ALPHA 1 URINE: 3.8 %
ALPHA 2 UR: 5.3 %
GAMMA GLOBULIN URINE: 9.4 %
TOTAL PROTEIN, URINE-UPE24: 148.8 mg/dL
TOTAL PROTEIN, URINE-UR/DAY: 4464 mg/(24.h) — AB (ref 30–150)
TOTAL VOLUME: 3000

## 2016-06-27 LAB — VAS US CAROTID
LCCAPSYS: 162 cm/s
LEFT ECA DIAS: -20 cm/s
LEFT VERTEBRAL DIAS: 21 cm/s
Left CCA dist dias: -33 cm/s
Left CCA dist sys: -132 cm/s
Left CCA prox dias: 29 cm/s
Left ICA dist dias: -44 cm/s
Left ICA dist sys: -94 cm/s
Left ICA prox dias: -14 cm/s
Left ICA prox sys: -75 cm/s
RCCADSYS: -79 cm/s
RIGHT ECA DIAS: -16 cm/s
RIGHT VERTEBRAL DIAS: 19 cm/s
Right CCA prox dias: 20 cm/s
Right CCA prox sys: 96 cm/s

## 2016-06-28 ENCOUNTER — Other Ambulatory Visit: Payer: Self-pay | Admitting: Vascular Surgery

## 2016-06-28 ENCOUNTER — Encounter: Payer: BLUE CROSS/BLUE SHIELD | Admitting: Internal Medicine

## 2016-06-28 DIAGNOSIS — Z01810 Encounter for preprocedural cardiovascular examination: Secondary | ICD-10-CM

## 2016-06-28 DIAGNOSIS — N185 Chronic kidney disease, stage 5: Secondary | ICD-10-CM

## 2016-07-04 ENCOUNTER — Encounter: Payer: Self-pay | Admitting: Vascular Surgery

## 2016-07-04 ENCOUNTER — Ambulatory Visit (INDEPENDENT_AMBULATORY_CARE_PROVIDER_SITE_OTHER): Payer: BLUE CROSS/BLUE SHIELD | Admitting: Internal Medicine

## 2016-07-04 ENCOUNTER — Encounter: Payer: Self-pay | Admitting: Internal Medicine

## 2016-07-04 VITALS — BP 126/80 | HR 70 | Temp 98.3°F | Resp 14 | Ht 76.0 in | Wt 242.2 lb

## 2016-07-04 DIAGNOSIS — I10 Essential (primary) hypertension: Secondary | ICD-10-CM

## 2016-07-04 DIAGNOSIS — N185 Chronic kidney disease, stage 5: Secondary | ICD-10-CM

## 2016-07-04 DIAGNOSIS — E785 Hyperlipidemia, unspecified: Secondary | ICD-10-CM | POA: Diagnosis not present

## 2016-07-04 NOTE — Progress Notes (Signed)
Pre visit review using our clinic review tool, if applicable. No additional management support is needed unless otherwise documented below in the visit note. 

## 2016-07-04 NOTE — Patient Instructions (Signed)
Continue monitoring your blood pressures   Discuss with Dr. [name] the option to start Lipitor and let me know  Next visit 6-8 months

## 2016-07-04 NOTE — Progress Notes (Signed)
Subjective:    Patient ID: Chad Adkins., male    DOB: Apr 28, 1965, 51 y.o.   MRN: [id number]  DOS:  07/04/2016 Type of visit - description : Follow-up Interval history: Since the last office visit, he was diagnosed with advanced renal failure, seen by nephrology, had hyperkalemia which is now medically treated. They are making plans for hemodialysis. Good medication compliance. Followsa renal diet. Ambulatory BPs seem satisfactory.    Review of Systems  Denies chest pain, difficulty breathing or lower extremity edema Continued to be fatigued, sluggish. No nausea, vomiting, diarrhea. No headaches.  Past Medical History:  Diagnosis Date  . Abnormal ECG 07/16/2012   echo--- [facility]  . Chronic kidney disease, stage V ([place]) 2018  . CRF (chronic renal failure) 2018  . H/O hyperkalemia   . Hypertension   . Kidney failure, acute ([place]) 2018    Past Surgical History:  Procedure Laterality Date  . HERNIA REPAIR    . ROTATOR CUFF REPAIR Right     Social History   Social History  . Marital status: Single    Spouse name: N/A  . Number of children: 2  . Years of education: N/A   Occupational History  . Freight forwarder , [employer]     Social History Main Topics  . Smoking status: Current Every Day Smoker    Types: Cigars  . Smokeless tobacco: Never Used     Comment: daily , 4-6 cigars a day  . Alcohol use 0.0 oz/week     Comment: socially   . Drug use: No  . Sexual activity: Not on file   Other Topics Concern  . Not on file   Social History Narrative   Lives by himself       Allergies as of 07/04/2016   No Known Allergies     Medication List       Accurate as of 07/04/16 11:59 PM. Always use your most recent med list.          amLODipine 10 MG tablet Commonly known as:  NORVASC Take 1 tablet (10 mg total) by mouth daily.   carvedilol 25 MG tablet Commonly known as:  COREG Take 1 tablet (25 mg total) by mouth 2 (two) times daily with a meal.           Objective:   Physical Exam BP 126/80 (BP Location: Left Arm, Patient Position: Sitting, Cuff Size: Normal)   Pulse 70   Temp 98.3 F (36.8 C) (Oral)   Resp 14   Ht 6\' 4"  (1.93 m)   Wt 242 lb 4 oz (109.9 kg)   SpO2 97%   BMI 29.49 kg/m  General:   Well developed, well nourished . NAD.  HEENT:  Normocephalic . Face symmetric, atraumatic Lungs:  CTA B Normal respiratory effort, no intercostal retractions, no accessory muscle use. Heart: RRR,  no murmur.  No pretibial edema bilaterally  Skin: Not pale. Not jaundice Neurologic:  alert & oriented X3.  Speech normal, gait appropriate for age and unassisted Psych--  Cognition and judgment appear intact.  Cooperative with normal attention span and concentration.  Behavior appropriate. No anxious or depressed appearing.      Assessment & Plan:   Assessment: CKD-5 DX 06/2016, creatinine 9.6, + hyperkalemia HTN Hyperlipidemia Chronic renal failure. Renal US 07/2012 no RAS, h/o increase creat w/ HCTZ 2014 [facility]: Abnormal EKG --->  echo 07/2012: [facility] Snoring: [place] (-) 09-2014  PLAN: CKD-5: under the care of nephrology, potassium few  days ago satisfactory, just had blood work done yesterday by nephrology. Recommend to continue working with them in preparation for hemodialysis. Has kayexalate in case he needs it. HTN: Currently on carvedilol, amlodipine. Ambulatory BPs 131/88, 129/84. BP today is very good. No change High cholesterol: Recommend to discuss with Dr. [name] appropriateness of Lipitor in this setting, to let me know if okay to start. Tobacco: Counseled,  still smoking but much less than before. Anemia: Recheck on RTC although typically is managed by nephrology. RTC 6-8 months

## 2016-07-05 DIAGNOSIS — N185 Chronic kidney disease, stage 5: Secondary | ICD-10-CM | POA: Insufficient documentation

## 2016-07-05 NOTE — Assessment & Plan Note (Signed)
CKD-5: under the care of nephrology, potassium few days ago satisfactory, just had blood work done yesterday by nephrology. Recommend to continue working with them in preparation for hemodialysis. Has kayexalate in case he needs it. HTN: Currently on carvedilol, amlodipine. Ambulatory BPs 131/88, 129/84. BP today is very good. No change High cholesterol: Recommend to discuss with Dr. [name] appropriateness of Lipitor in this setting, to let me know if okay to start. Tobacco: Counseled,  still smoking but much less than before. Anemia: Recheck on RTC although typically is managed by nephrology. RTC 6-8 months

## 2016-07-12 ENCOUNTER — Ambulatory Visit (INDEPENDENT_AMBULATORY_CARE_PROVIDER_SITE_OTHER)
Admission: RE | Admit: 2016-07-12 | Discharge: 2016-07-12 | Disposition: A | Discharge status: Home / Self Care | Payer: BLUE CROSS/BLUE SHIELD | Source: Ambulatory Visit | Attending: Vascular Surgery | Admitting: Vascular Surgery

## 2016-07-12 ENCOUNTER — Ambulatory Visit (INDEPENDENT_AMBULATORY_CARE_PROVIDER_SITE_OTHER): Payer: BLUE CROSS/BLUE SHIELD | Admitting: Vascular Surgery

## 2016-07-12 ENCOUNTER — Encounter: Payer: Self-pay | Admitting: Vascular Surgery

## 2016-07-12 ENCOUNTER — Ambulatory Visit (HOSPITAL_COMMUNITY)
Admission: RE | Admit: 2016-07-12 | Discharge: 2016-07-12 | Disposition: A | Discharge status: Home / Self Care | Payer: BLUE CROSS/BLUE SHIELD | Source: Ambulatory Visit | Attending: Vascular Surgery | Admitting: Vascular Surgery

## 2016-07-12 VITALS — BP 119/82 | HR 62 | Temp 98.6°F | Resp 16 | Ht 76.0 in | Wt 242.0 lb

## 2016-07-12 DIAGNOSIS — N185 Chronic kidney disease, stage 5: Secondary | ICD-10-CM

## 2016-07-12 DIAGNOSIS — Z01810 Encounter for preprocedural cardiovascular examination: Secondary | ICD-10-CM

## 2016-07-12 DIAGNOSIS — I82612 Acute embolism and thrombosis of superficial veins of left upper extremity: Secondary | ICD-10-CM | POA: Diagnosis not present

## 2016-07-12 NOTE — Progress Notes (Signed)
Patient name: Chad Adkins. MRN: [id number] DOB: 01-11-1966 Sex: male   REASON FOR CONSULT:    To evaluate for hemodialysis access. Referred by Dr. [name].  HPI:   Chad Adkins. is a 51 y.o. male, Who is referred for evaluation for hemodialysis access. He has chronic kidney disease secondary to hypertension. He is right-handed. He denies any recent uremic symptoms. Specifically, he denies nausea, vomiting, fatigue, anorexia, or palpitations.  I have reviewed the records that were sent from [facility]. He has a history of chronic kidney disease and also poorly controlled hypertension. GFR is most recent labs was 7.  Past Medical History:  Diagnosis Date  . Abnormal ECG 07/16/2012   echo--- LVH  . Chronic kidney disease, stage V ([place]) 2018  . CRF (chronic renal failure) 2018  . H/O hyperkalemia   . Hypertension   . Kidney failure, acute ([place]) 2018    Family History  Problem Relation Age of Onset  . Hyperlipidemia Father   . Hypertension Mother   . Breast cancer Mother   . CAD Neg Hx   . Diabetes Neg Hx   . Stroke Neg Hx   . Colon cancer Neg Hx   . Prostate cancer Neg Hx     SOCIAL HISTORY: Social History   Social History  . Marital status: Single    Spouse name: N/A  . Number of children: 2  . Years of education: N/A   Occupational History  . Freight forwarder , [employer]     Social History Main Topics  . Smoking status: Current Every Day Smoker    Types: Cigars  . Smokeless tobacco: Never Used     Comment: Has not had a cigar x 1 wk.   . Alcohol use 0.0 oz/week     Comment: socially   . Drug use: No  . Sexual activity: Not on file   Other Topics Concern  . Not on file   Social History Narrative   Lives by himself     No Known Allergies  Current Outpatient Prescriptions  Medication Sig Dispense Refill  . amLODipine (NORVASC) 10 MG tablet Take 1 tablet (10 mg total) by mouth daily. 90 tablet 3  . carvedilol (COREG) 25  MG tablet Take 1 tablet (25 mg total) by mouth 2 (two) times daily with a meal. 180 tablet 3   No current facility-administered medications for this visit.     REVIEW OF SYSTEMS:  [X]  denotes positive finding, [ ]  denotes negative finding Cardiac  Comments:  Chest pain or chest pressure:    Shortness of breath upon exertion: X   Short of breath when lying flat:    Irregular heart rhythm:        Vascular    Pain in calf, thigh, or hip brought on by ambulation:    Pain in feet at night that wakes you up from your sleep:     Blood clot in your veins:    Leg swelling:         Pulmonary    Oxygen at home:    Productive cough:     Wheezing:         Neurologic    Sudden weakness in arms or legs:     Sudden numbness in arms or legs:     Sudden onset of difficulty speaking or slurred speech:    Temporary loss of vision in one eye:     Problems with dizziness:  Gastrointestinal    Blood in stool:     Vomited blood:         Genitourinary    Burning when urinating:     Blood in urine:        Psychiatric    Major depression:         Hematologic    Bleeding problems:    Problems with blood clotting too easily:        Skin    Rashes or ulcers:        Constitutional    Fever or chills:     PHYSICAL EXAM:   Vitals:   07/12/16 1523  BP: 119/82  Pulse: 62  Resp: 16  Temp: 98.6 F (37 C)  TempSrc: Oral  SpO2: 100%  Weight: 242 lb (109.8 kg)  Height: 6\' 4"  (1.93 m)    GENERAL: The patient is a well-nourished male, in no acute distress. The vital signs are documented above. CARDIAC: There is a regular rate and rhythm.  VASCULAR: I do not detect carotid bruits. He has palpable radial pulses bilaterally. On exam he has a palpable cord in his left upper arm which represents a thrombosed cephalic vein. He tells me that he did have an IV in that arm recently and he was in the hospital. PULMONARY: There is good air exchange bilaterally without wheezing or  rales. ABDOMEN: Soft and non-tender with normal pitched bowel sounds.  MUSCULOSKELETAL: There are no major deformities or cyanosis. NEUROLOGIC: No focal weakness or paresthesias are detected. SKIN: There are no ulcers or rashes noted. PSYCHIATRIC: The patient has a normal affect.  DATA:    UPPER EXTREMITY VEIN MAP: I have independently interpreted his upper extremity vein map.  On the left side, there is acute thrombus in the cephalic vein. The basilic vein is reasonable in size although it is short and empties into the brachial system early.  On the right side the forearm and upper arm cephalic vein looked reasonable in size. The basilic vein likewise is reasonable in size although it empties into the brachial system early.  UPPER EXTREMITY ARTERIAL DUPLEX: I have independently interpreted his upper extremity arterial duplex scan.  On the left side, there is a triphasic radial and ulnar signal. The brachial artery is 0.69 cm in diameter.  On the right side, there is a triphasic radial and ulnar signal. The brachial artery is 0.69 cm in diameter.  MEDICAL ISSUES:   STAGE V CHRONIC KIDNEY DISEASE:  The patient appears to be a good candidate for a right arm fistula. He has occlusion of the cephalic vein on the left from thrombus likely related to an IV from her previous hospitalization. We have discussed the indications for placement of the fistula and the potential complications. Currently he is not agreeable to proceed and would like to discuss this further with Dr. [name]. I'll be happy to schedule him for surgery once he is agreeable.  [name] [facility] [phone]

## 2016-07-14 ENCOUNTER — Encounter: Payer: Self-pay | Admitting: Nephrology

## 2016-07-19 LAB — HEPATIC FUNCTION PANEL
ALT: 23 (ref 10–40)
AST: 17 (ref 14–40)
Alkaline Phosphatase: 74 (ref 25–125)

## 2016-07-19 LAB — BASIC METABOLIC PANEL
BUN: 49 — AB (ref 4–21)
CREATININE: 10 — AB (ref 0.6–1.3)
GLUCOSE: 62
POTASSIUM: 5.8 — AB (ref 3.4–5.3)
Sodium: 136 — AB (ref 137–147)

## 2016-07-19 LAB — CBC AND DIFFERENTIAL
HCT: 33 — AB (ref 41–53)
HEMOGLOBIN: 11 — AB (ref 13.5–17.5)
Neutrophils Absolute: 2
Platelets: 217 (ref 150–399)
WBC: 3.7

## 2016-08-01 LAB — CBC AND DIFFERENTIAL
HCT: 33 — AB (ref 41–53)
Hemoglobin: 10.8 — AB (ref 13.5–17.5)
Neutrophils Absolute: 2
Platelets: 212 (ref 150–399)
WBC: 3.4

## 2016-08-01 LAB — BASIC METABOLIC PANEL
BUN: 52 — AB (ref 4–21)
CREATININE: 8.6 — AB (ref 0.6–1.3)
Glucose: 79
Potassium: 5.5 — AB (ref 3.4–5.3)
SODIUM: 134 — AB (ref 137–147)

## 2016-08-01 LAB — HEPATIC FUNCTION PANEL
ALK PHOS: 78 (ref 25–125)
ALT: 16 (ref 10–40)
AST: 13 — AB (ref 14–40)
Bilirubin, Total: 0.2

## 2016-08-02 ENCOUNTER — Encounter: Payer: Self-pay | Admitting: Internal Medicine

## 2016-08-17 ENCOUNTER — Encounter: Payer: Self-pay | Admitting: Internal Medicine

## 2016-09-06 LAB — CBC AND DIFFERENTIAL
HEMATOCRIT: 29 — AB (ref 41–53)
HEMOGLOBIN: 9.6 — AB (ref 13.5–17.5)
Neutrophils Absolute: 2
Platelets: 223 (ref 150–399)
WBC: 5

## 2016-09-06 LAB — BASIC METABOLIC PANEL
BUN: 57 — AB (ref 4–21)
Creatinine: 9.8 — AB (ref 0.6–1.3)
Glucose: 90
POTASSIUM: 6.1 — AB (ref 3.4–5.3)
SODIUM: 138 (ref 137–147)

## 2016-09-06 LAB — HEPATIC FUNCTION PANEL
ALT: 11 (ref 10–40)
AST: 11 — AB (ref 14–40)
Alkaline Phosphatase: 59 (ref 25–125)
Bilirubin, Total: 0.2

## 2016-09-13 ENCOUNTER — Ambulatory Visit: Payer: BLUE CROSS/BLUE SHIELD | Admitting: Internal Medicine

## 2016-09-21 ENCOUNTER — Encounter: Payer: Self-pay | Admitting: Internal Medicine

## 2016-10-03 ENCOUNTER — Encounter: Payer: Self-pay | Admitting: Vascular Surgery

## 2016-10-11 ENCOUNTER — Encounter: Payer: Self-pay | Admitting: *Deleted

## 2016-10-11 ENCOUNTER — Encounter: Payer: Self-pay | Admitting: Vascular Surgery

## 2016-10-11 ENCOUNTER — Ambulatory Visit (INDEPENDENT_AMBULATORY_CARE_PROVIDER_SITE_OTHER): Payer: BLUE CROSS/BLUE SHIELD | Admitting: Vascular Surgery

## 2016-10-11 VITALS — BP 148/91 | HR 67 | Temp 98.6°F | Ht 76.0 in | Wt 233.0 lb

## 2016-10-11 DIAGNOSIS — N185 Chronic kidney disease, stage 5: Secondary | ICD-10-CM | POA: Diagnosis not present

## 2016-10-11 NOTE — Progress Notes (Signed)
Patient name: Chad Adkins. MRN: [id number] DOB: 04-Apr-1965 Sex: male  REASON FOR VISIT:    To rediscuss hemodialysis access.  HPI:   Chad Adkins. is a pleasant 51 y.o. male who I saw in consultation on 07/12/2016. He was referred for evaluation for hemodialysis access. He has stage V chronic kidney disease secondary to hypertension. He is right-handed. His vein mapping at that time showed that he was a good candidate for a right arm fistula. He had occlusion of the cephalic vein on the left from thrombus. I discussed the indications for placement of the fistula and potential complications. He was not agreeable to proceed at that time. He is back today as he is now willing to proceed with surgery.  He is not on dialysis. He denies any recent uremic symptoms. Specifically he denies nausea, vomiting, fatigue, anorexia, or palpitations.  Past Medical History:  Diagnosis Date  . Abnormal ECG 07/16/2012   echo--- LVH  . Chronic kidney disease, stage V ([place]) 2018  . CRF (chronic renal failure) 2018  . H/O hyperkalemia   . Hypertension   . Kidney failure, acute ([place]) 2018    Family History  Problem Relation Age of Onset  . Hyperlipidemia Father   . Hypertension Mother   . Breast cancer Mother   . CAD Neg Hx   . Diabetes Neg Hx   . Stroke Neg Hx   . Colon cancer Neg Hx   . Prostate cancer Neg Hx     SOCIAL HISTORY: Social History  Substance Use Topics  . Smoking status: Current Every Day Smoker    Types: Cigars  . Smokeless tobacco: Never Used     Comment: Has not had a cigar x 1 wk.   . Alcohol use 0.0 oz/week     Comment: socially     No Known Allergies  Current Outpatient Prescriptions  Medication Sig Dispense Refill  . amLODipine (NORVASC) 10 MG tablet Take 1 tablet (10 mg total) by mouth daily. 90 tablet 3  . carvedilol (COREG) 25 MG tablet Take 1 tablet (25 mg total) by mouth 2 (two) times daily with a meal. 180 tablet 3   No current  facility-administered medications for this visit.     REVIEW OF SYSTEMS:  [X]  denotes positive finding, [ ]  denotes negative finding Cardiac  Comments:  Chest pain or chest pressure:    Shortness of breath upon exertion:    Short of breath when lying flat:    Irregular heart rhythm:        Vascular    Pain in calf, thigh, or hip brought on by ambulation:    Pain in feet at night that wakes you up from your sleep:     Blood clot in your veins:    Leg swelling:  X       Pulmonary    Oxygen at home:    Productive cough:     Wheezing:         Neurologic    Sudden weakness in arms or legs:     Sudden numbness in arms or legs:     Sudden onset of difficulty speaking or slurred speech:    Temporary loss of vision in one eye:     Problems with dizziness:         Gastrointestinal    Blood in stool:     Vomited blood:         Genitourinary    Burning when  urinating:     Blood in urine:        Psychiatric    Major depression:         Hematologic    Bleeding problems:    Problems with blood clotting too easily:        Skin    Rashes or ulcers:        Constitutional    Fever or chills:     PHYSICAL EXAM:   Vitals:   10/11/16 1443  BP: (!) 148/91  Pulse: 67  Temp: 98.6 F (37 C)  TempSrc: Oral  SpO2: 99%  Weight: 233 lb (105.7 kg)  Height: 6\' 4"  (1.93 m)    GENERAL: The patient is a well-nourished male, in no acute distress. The vital signs are documented above. CARDIAC: There is a regular rate and rhythm.  VASCULAR: He has a palpable radial pulse bilaterally. PULMONARY: There is good air exchange bilaterally without wheezing or rales. SKIN: There are no ulcers or rashes noted. PSYCHIATRIC: The patient has a normal affect.  DATA:    VEIN MAP: I did review his previous vein map that was done in May. On the left side he had thrombus in the cephalic vein and therefore I did not was a candidate for a radiocephalic or brachiocephalic fistula on the left. The  forearm and upper arm cephalic veins on the right look reasonable in size and I recommended a fistula in the right arm.  LABS:  Labs on 10/10/2016 show a GFR of 6.  MEDICAL ISSUES:   STAGE V CHRONIC KIDNEY DISEASE: The patient is now willing to proceed with the fistula. I would recommend placing a fistula in the right arm. He had clot his cephalic vein on the left. We have again discussed the indications for the procedure and the potential, occasions and he is agreeable to proceed. His surgery is scheduled for 10/17/2016. Given that his GFR has decreased significantly if he were not a candidate for a fistula that would probably be best to place a graft at that time.  [name] [facility] [place] [phone]

## 2016-10-12 ENCOUNTER — Encounter: Payer: Self-pay | Admitting: Nephrology

## 2016-10-12 ENCOUNTER — Other Ambulatory Visit: Payer: Self-pay | Admitting: *Deleted

## 2016-10-14 ENCOUNTER — Encounter (HOSPITAL_COMMUNITY): Payer: Self-pay | Admitting: *Deleted

## 2016-10-14 NOTE — Progress Notes (Signed)
Denies chest pain, shortness of breath. Denies being under care of cardiologist. States he is not sure who is going to drive him home and stay with him yet. I advised patient that if he did not find someone to call his surgeon on Monday.

## 2016-10-16 NOTE — Progress Notes (Signed)
Anesthesia Chart Review:  Pt is a same day work up.  Pt is a 51 year old male scheduled for R AV fistula creation on 10/17/2016 with [name], MD  - PCP is Kathlene November, MD - Nephrologist is [name], MD  PMH includes:  HTN, CKD (stage V). Current smoker. BMI 28  - Hospitalized 5/4-6/18 for AKI, syncope (while in hospital due to orthostatic hypotension)  Medications include: Amlodipine, carvedilol  Labs will be obtained DOS.  1 view CXR 06/23/16: No active disease.  EKG 06/24/16: Sinus bradycardia (58 bpm). Nonspecific T wave abnormality  Carotid duplex 06/25/16:  - Findings suggest 1-39% internal carotid artery stenosis bilaterally.  - Vertebral arteries are patent with antegrade flow.  Echo 07/23/12 (done as part of work up for possible TIA:  - Left ventricle: The cavity size was normal. Wall thickness as increased in a pattern of moderate LVH. Systolic function was normal. The estimated ejection fraction was in the range of 55% to 60%. Wall motion was normal; there were no regional wall motion abnormalities. Doppler parameters are consistent with abnormal left ventricular relaxation (grade 1 diastolic dysfunction).  If labs acceptable DOS, I anticipate pt can proceed as scheduled.   Willeen Cass, FNP-BC [facility]/Anesthesiology Phone: [phone] 10/16/2016 10:38 AM]

## 2016-10-16 NOTE — Anesthesia Preprocedure Evaluation (Addendum)
Anesthesia Evaluation  Patient identified by MRN, date of birth, ID band Patient awake    History of Anesthesia Complications Negative for: history of anesthetic complications  Airway Mallampati: I  TM Distance: >3 FB Neck ROM: Full    Dental no notable dental hx.    Pulmonary Current Smoker,    breath sounds clear to auscultation       Cardiovascular hypertension,  Rhythm:Regular Rate:Normal     Neuro/Psych TIA   GI/Hepatic   Endo/Other    Renal/GU ESRFRenal disease     Musculoskeletal   Abdominal   Peds  Hematology   Anesthesia Other Findings   Reproductive/Obstetrics                            Anesthesia Physical Anesthesia Plan  ASA: III  Anesthesia Plan: MAC   Post-op Pain Management:    Induction: Intravenous  PONV Risk Score and Plan: 1 and Ondansetron and Dexamethasone  Airway Management Planned: Natural Airway and Simple Face Mask  Additional Equipment:   Intra-op Plan:   Post-operative Plan:   Informed Consent: I have reviewed the patients History and Physical, chart, labs and discussed the procedure including the risks, benefits and alternatives for the proposed anesthesia with the patient or authorized representative who has indicated his/her understanding and acceptance.     Plan Discussed with: CRNA  Anesthesia Plan Comments:         Anesthesia Quick Evaluation

## 2016-10-17 ENCOUNTER — Ambulatory Visit (HOSPITAL_COMMUNITY)
Admission: RE | Admit: 2016-10-17 | Discharge: 2016-10-17 | Disposition: A | Discharge status: Home / Self Care | Payer: BLUE CROSS/BLUE SHIELD | Source: Ambulatory Visit | Attending: Vascular Surgery | Admitting: Vascular Surgery

## 2016-10-17 ENCOUNTER — Ambulatory Visit (HOSPITAL_COMMUNITY): Payer: BLUE CROSS/BLUE SHIELD | Admitting: Emergency Medicine

## 2016-10-17 ENCOUNTER — Encounter (HOSPITAL_COMMUNITY): Payer: Self-pay

## 2016-10-17 ENCOUNTER — Encounter (HOSPITAL_COMMUNITY)
Admission: RE | Disposition: A | Discharge status: Home / Self Care | Payer: Self-pay | Source: Ambulatory Visit | Attending: Vascular Surgery

## 2016-10-17 DIAGNOSIS — Z8249 Family history of ischemic heart disease and other diseases of the circulatory system: Secondary | ICD-10-CM | POA: Insufficient documentation

## 2016-10-17 DIAGNOSIS — Z992 Dependence on renal dialysis: Secondary | ICD-10-CM | POA: Diagnosis not present

## 2016-10-17 DIAGNOSIS — Z79899 Other long term (current) drug therapy: Secondary | ICD-10-CM | POA: Diagnosis not present

## 2016-10-17 DIAGNOSIS — N184 Chronic kidney disease, stage 4 (severe): Secondary | ICD-10-CM | POA: Insufficient documentation

## 2016-10-17 DIAGNOSIS — N185 Chronic kidney disease, stage 5: Secondary | ICD-10-CM

## 2016-10-17 DIAGNOSIS — F17290 Nicotine dependence, other tobacco product, uncomplicated: Secondary | ICD-10-CM | POA: Insufficient documentation

## 2016-10-17 DIAGNOSIS — I129 Hypertensive chronic kidney disease with stage 1 through stage 4 chronic kidney disease, or unspecified chronic kidney disease: Secondary | ICD-10-CM | POA: Insufficient documentation

## 2016-10-17 HISTORY — PX: AV FISTULA PLACEMENT: SHX1204

## 2016-10-17 LAB — POCT I-STAT 4, (NA,K, GLUC, HGB,HCT)
Glucose, Bld: 82 mg/dL (ref 65–99)
HCT: 26 % — ABNORMAL LOW (ref 39.0–52.0)
HEMOGLOBIN: 8.8 g/dL — AB (ref 13.0–17.0)
POTASSIUM: 4.8 mmol/L (ref 3.5–5.1)
Sodium: 141 mmol/L (ref 135–145)

## 2016-10-17 SURGERY — ARTERIOVENOUS (AV) FISTULA CREATION
Anesthesia: Monitor Anesthesia Care | Laterality: Right

## 2016-10-17 MED ORDER — HEPARIN SODIUM (PORCINE) 1000 UNIT/ML IJ SOLN
INTRAMUSCULAR | Status: AC
  Filled 2016-10-17: qty 1

## 2016-10-17 MED ORDER — CHLORHEXIDINE GLUCONATE 4 % EX LIQD: 60.0000 mL | Freq: Once | CUTANEOUS | Status: DC

## 2016-10-17 MED ORDER — ONDANSETRON HCL 4 MG/2ML IJ SOLN
INTRAMUSCULAR | Status: DC | PRN
  Administered 2016-10-17: 4 mg via INTRAVENOUS

## 2016-10-17 MED ORDER — OXYCODONE-ACETAMINOPHEN 5-325 MG PO TABS: 1.0000 | ORAL_TABLET | Freq: Four times a day (QID) | ORAL | 0 refills | Status: DC | PRN

## 2016-10-17 MED ORDER — LIDOCAINE HCL (CARDIAC) 20 MG/ML IV SOLN
INTRAVENOUS | Status: DC | PRN
  Administered 2016-10-17: 40 mg via INTRAVENOUS

## 2016-10-17 MED ORDER — FENTANYL CITRATE (PF) 250 MCG/5ML IJ SOLN
INTRAMUSCULAR | Status: AC
  Filled 2016-10-17: qty 5

## 2016-10-17 MED ORDER — LIDOCAINE 2% (20 MG/ML) 5 ML SYRINGE
INTRAMUSCULAR | Status: AC
  Filled 2016-10-17: qty 5

## 2016-10-17 MED ORDER — SODIUM CHLORIDE 0.9 % IV SOLN
INTRAVENOUS | Status: DC | PRN
  Administered 2016-10-17: 07:00:00 via INTRAVENOUS

## 2016-10-17 MED ORDER — MIDAZOLAM HCL 2 MG/2ML IJ SOLN
INTRAMUSCULAR | Status: AC
  Filled 2016-10-17: qty 2

## 2016-10-17 MED ORDER — PHENYLEPHRINE HCL 10 MG/ML IJ SOLN
INTRAMUSCULAR | Status: AC
  Filled 2016-10-17: qty 1

## 2016-10-17 MED ORDER — SODIUM CHLORIDE 0.9 % IV SOLN
INTRAVENOUS | Status: DC | PRN
  Administered 2016-10-17: 08:00:00

## 2016-10-17 MED ORDER — FENTANYL CITRATE (PF) 100 MCG/2ML IJ SOLN
INTRAMUSCULAR | Status: DC | PRN
  Administered 2016-10-17 (×2): 50 ug via INTRAVENOUS

## 2016-10-17 MED ORDER — 0.9 % SODIUM CHLORIDE (POUR BTL) OPTIME
TOPICAL | Status: DC | PRN
  Administered 2016-10-17: 1000 mL

## 2016-10-17 MED ORDER — PROTAMINE SULFATE 10 MG/ML IV SOLN
INTRAVENOUS | Status: DC | PRN
Start: 2016-10-17 — End: 2016-10-17
  Administered 2016-10-17: 40 mg via INTRAVENOUS

## 2016-10-17 MED ORDER — PROMETHAZINE HCL 25 MG/ML IJ SOLN: 6.2500 mg | INTRAMUSCULAR | Status: DC | PRN

## 2016-10-17 MED ORDER — LIDOCAINE-EPINEPHRINE (PF) 1 %-1:200000 IJ SOLN
INTRAMUSCULAR | Status: AC
  Filled 2016-10-17: qty 30

## 2016-10-17 MED ORDER — SODIUM CHLORIDE 0.9 % IV SOLN: INTRAVENOUS | Status: DC

## 2016-10-17 MED ORDER — PROPOFOL 500 MG/50ML IV EMUL
INTRAVENOUS | Status: DC | PRN
  Administered 2016-10-17: 75 ug/kg/min via INTRAVENOUS

## 2016-10-17 MED ORDER — HYDROMORPHONE HCL 1 MG/ML IJ SOLN: 0.2500 mg | INTRAMUSCULAR | Status: DC | PRN

## 2016-10-17 MED ORDER — LIDOCAINE HCL (PF) 1 % IJ SOLN
INTRAMUSCULAR | Status: DC | PRN
  Administered 2016-10-17: 12 mL

## 2016-10-17 MED ORDER — DEXAMETHASONE SODIUM PHOSPHATE 10 MG/ML IJ SOLN
INTRAMUSCULAR | Status: AC
  Filled 2016-10-17: qty 1

## 2016-10-17 MED ORDER — HEPARIN SODIUM (PORCINE) 1000 UNIT/ML IJ SOLN
INTRAMUSCULAR | Status: DC | PRN
  Administered 2016-10-17: 8000 [IU] via INTRAVENOUS

## 2016-10-17 MED ORDER — PROPOFOL 10 MG/ML IV BOLUS
INTRAVENOUS | Status: DC | PRN
  Administered 2016-10-17: 20 mg via INTRAVENOUS

## 2016-10-17 MED ORDER — DEXTROSE 5 % IV SOLN
INTRAVENOUS | Status: AC
  Filled 2016-10-17: qty 1.5

## 2016-10-17 MED ORDER — DEXTROSE 5 % IV SOLN
1.5000 g | INTRAVENOUS | Status: AC
  Administered 2016-10-17: 1.5 g via INTRAVENOUS

## 2016-10-17 MED ORDER — THROMBIN 20000 UNITS EX SOLR
CUTANEOUS | Status: AC
  Filled 2016-10-17: qty 20000

## 2016-10-17 MED ORDER — ONDANSETRON HCL 4 MG/2ML IJ SOLN
INTRAMUSCULAR | Status: AC
  Filled 2016-10-17: qty 2

## 2016-10-17 MED ORDER — MIDAZOLAM HCL 5 MG/5ML IJ SOLN
INTRAMUSCULAR | Status: DC | PRN
  Administered 2016-10-17: 2 mg via INTRAVENOUS

## 2016-10-17 SURGICAL SUPPLY — 36 items
ARMBAND PINK RESTRICT EXTREMIT (MISCELLANEOUS) ×2 IMPLANT
CANISTER SUCT 3000ML PPV (MISCELLANEOUS) ×2 IMPLANT
CANNULA VESSEL 3MM 2 BLNT TIP (CANNULA) ×2 IMPLANT
CLIP VESOCCLUDE MED 6/CT (CLIP) ×2 IMPLANT
CLIP VESOCCLUDE SM WIDE 6/CT (CLIP) ×4 IMPLANT
COVER PROBE W GEL 5X96 (DRAPES) ×2 IMPLANT
DECANTER SPIKE VIAL GLASS SM (MISCELLANEOUS) ×2 IMPLANT
DERMABOND ADVANCED (GAUZE/BANDAGES/DRESSINGS) ×1
DERMABOND ADVANCED .7 DNX12 (GAUZE/BANDAGES/DRESSINGS) ×1 IMPLANT
ELECT REM PT RETURN 9FT ADLT (ELECTROSURGICAL) ×2
ELECTRODE REM PT RTRN 9FT ADLT (ELECTROSURGICAL) ×1 IMPLANT
GLOVE BIO SURGEON STRL SZ 6.5 (GLOVE) ×4 IMPLANT
GLOVE BIO SURGEON STRL SZ7.5 (GLOVE) ×2 IMPLANT
GLOVE BIOGEL PI IND STRL 6.5 (GLOVE) ×2 IMPLANT
GLOVE BIOGEL PI IND STRL 7.0 (GLOVE) ×1 IMPLANT
GLOVE BIOGEL PI IND STRL 8 (GLOVE) ×1 IMPLANT
GLOVE BIOGEL PI INDICATOR 6.5 (GLOVE) ×2
GLOVE BIOGEL PI INDICATOR 7.0 (GLOVE) ×1
GLOVE BIOGEL PI INDICATOR 8 (GLOVE) ×1
GLOVE SURG SS PI 6.5 STRL IVOR (GLOVE) ×4 IMPLANT
GOWN STRL REUS W/ TWL LRG LVL3 (GOWN DISPOSABLE) ×3 IMPLANT
GOWN STRL REUS W/TWL LRG LVL3 (GOWN DISPOSABLE) ×3
KIT BASIN OR (CUSTOM PROCEDURE TRAY) ×2 IMPLANT
KIT ROOM TURNOVER OR (KITS) ×2 IMPLANT
NS IRRIG 1000ML POUR BTL (IV SOLUTION) ×2 IMPLANT
PACK CV ACCESS (CUSTOM PROCEDURE TRAY) ×2 IMPLANT
PAD ARMBOARD 7.5X6 YLW CONV (MISCELLANEOUS) ×4 IMPLANT
SPONGE SURGIFOAM ABS GEL 100 (HEMOSTASIS) IMPLANT
SUT PROLENE 6 0 BV (SUTURE) ×2 IMPLANT
SUT SILK 3 0 (SUTURE) ×1
SUT SILK 3-0 18XBRD TIE 12 (SUTURE) ×1 IMPLANT
SUT VIC AB 3-0 SH 27 (SUTURE) ×1
SUT VIC AB 3-0 SH 27X BRD (SUTURE) ×1 IMPLANT
SUT VICRYL 4-0 PS2 18IN ABS (SUTURE) ×2 IMPLANT
UNDERPAD 30X30 (UNDERPADS AND DIAPERS) ×2 IMPLANT
WATER STERILE IRR 1000ML POUR (IV SOLUTION) ×2 IMPLANT

## 2016-10-17 NOTE — Progress Notes (Signed)
Patient's BP elevated this morning, 169/118, 159/107, 165/106.  Patient's Hgb 8.8 on istat.  Dr. Smith Robert notified and no new orders received.

## 2016-10-17 NOTE — H&P (View-Only) (Signed)
Patient name: Chad Adkins. MRN: [id number] DOB: Aug 05, 1965 Sex: male  REASON FOR VISIT:    To rediscuss hemodialysis access.  HPI:   Chad Adkins. is a pleasant 51 y.o. male who I saw in consultation on 07/12/2016. He was referred for evaluation for hemodialysis access. He has stage V chronic kidney disease secondary to hypertension. He is right-handed. His vein mapping at that time showed that he was a good candidate for a right arm fistula. He had occlusion of the cephalic vein on the left from thrombus. I discussed the indications for placement of the fistula and potential complications. He was not agreeable to proceed at that time. He is back today as he is now willing to proceed with surgery.  He is not on dialysis. He denies any recent uremic symptoms. Specifically he denies nausea, vomiting, fatigue, anorexia, or palpitations.  Past Medical History:  Diagnosis Date  . Abnormal ECG 07/16/2012   echo--- LVH  . Chronic kidney disease, stage V ([place]) 2018  . CRF (chronic renal failure) 2018  . H/O hyperkalemia   . Hypertension   . Kidney failure, acute ([place]) 2018    Family History  Problem Relation Age of Onset  . Hyperlipidemia Father   . Hypertension Mother   . Breast cancer Mother   . CAD Neg Hx   . Diabetes Neg Hx   . Stroke Neg Hx   . Colon cancer Neg Hx   . Prostate cancer Neg Hx     SOCIAL HISTORY: Social History  Substance Use Topics  . Smoking status: Current Every Day Smoker    Types: Cigars  . Smokeless tobacco: Never Used     Comment: Has not had a cigar x 1 wk.   . Alcohol use 0.0 oz/week     Comment: socially     No Known Allergies  Current Outpatient Prescriptions  Medication Sig Dispense Refill  . amLODipine (NORVASC) 10 MG tablet Take 1 tablet (10 mg total) by mouth daily. 90 tablet 3  . carvedilol (COREG) 25 MG tablet Take 1 tablet (25 mg total) by mouth 2 (two) times daily with a meal. 180 tablet 3   No current  facility-administered medications for this visit.     REVIEW OF SYSTEMS:  [X]  denotes positive finding, [ ]  denotes negative finding Cardiac  Comments:  Chest pain or chest pressure:    Shortness of breath upon exertion:    Short of breath when lying flat:    Irregular heart rhythm:        Vascular    Pain in calf, thigh, or hip brought on by ambulation:    Pain in feet at night that wakes you up from your sleep:     Blood clot in your veins:    Leg swelling:  X       Pulmonary    Oxygen at home:    Productive cough:     Wheezing:         Neurologic    Sudden weakness in arms or legs:     Sudden numbness in arms or legs:     Sudden onset of difficulty speaking or slurred speech:    Temporary loss of vision in one eye:     Problems with dizziness:         Gastrointestinal    Blood in stool:     Vomited blood:         Genitourinary    Burning when  urinating:     Blood in urine:        Psychiatric    Major depression:         Hematologic    Bleeding problems:    Problems with blood clotting too easily:        Skin    Rashes or ulcers:        Constitutional    Fever or chills:     PHYSICAL EXAM:   Vitals:   10/11/16 1443  BP: (!) 148/91  Pulse: 67  Temp: 98.6 F (37 C)  TempSrc: Oral  SpO2: 99%  Weight: 233 lb (105.7 kg)  Height: 6\' 4"  (1.93 m)    GENERAL: The patient is a well-nourished male, in no acute distress. The vital signs are documented above. CARDIAC: There is a regular rate and rhythm.  VASCULAR: He has a palpable radial pulse bilaterally. PULMONARY: There is good air exchange bilaterally without wheezing or rales. SKIN: There are no ulcers or rashes noted. PSYCHIATRIC: The patient has a normal affect.  DATA:    VEIN MAP: I did review his previous vein map that was done in May. On the left side he had thrombus in the cephalic vein and therefore I did not was a candidate for a radiocephalic or brachiocephalic fistula on the left. The  forearm and upper arm cephalic veins on the right look reasonable in size and I recommended a fistula in the right arm.  LABS:  Labs on 10/10/2016 show a GFR of 6.  MEDICAL ISSUES:   STAGE V CHRONIC KIDNEY DISEASE: The patient is now willing to proceed with the fistula. I would recommend placing a fistula in the right arm. He had clot his cephalic vein on the left. We have again discussed the indications for the procedure and the potential, occasions and he is agreeable to proceed. His surgery is scheduled for 10/17/2016. Given that his GFR has decreased significantly if he were not a candidate for a fistula that would probably be best to place a graft at that time.  [name] [facility] [place] [phone]

## 2016-10-17 NOTE — Anesthesia Procedure Notes (Signed)
Procedure Name: MAC Date/Time: 10/17/2016 7:35 AM Performed by: Neldon Newport Pre-anesthesia Checklist: Timeout performed, Patient being monitored, Suction available, Emergency Drugs available and Patient identified Patient Re-evaluated:Patient Re-evaluated prior to induction Oxygen Delivery Method: Simple face mask Placement Confirmation: positive ETCO2 Dental Injury: Teeth and Oropharynx as per pre-operative assessment

## 2016-10-17 NOTE — Interval H&P Note (Signed)
History and Physical Interval Note:  10/17/2016 7:20 AM  Chad Adkins.  has presented today for surgery, with the diagnosis of chronic kidney disease  The various methods of treatment have been discussed with the patient and family. After consideration of risks, benefits and other options for treatment, the patient has consented to  Procedure(s): ARTERIOVENOUS (AV) FISTULA CREATION RIGHT ARM (Right) as a surgical intervention .  The patient's history has been reviewed, patient examined, no change in status, stable for surgery.  I have reviewed the patient's chart and labs.  Questions were answered to the patient's satisfaction.     [name]

## 2016-10-17 NOTE — Transfer of Care (Signed)
Immediate Anesthesia Transfer of Care Note  Patient: Chad Adkins.  Procedure(s) Performed: Procedure(s): ARTERIOVENOUS (AV) FISTULA CREATION RIGHT ARM (Right)  Patient Location: PACU  Anesthesia Type:MAC  Level of Consciousness: awake, alert  and oriented  Airway & Oxygen Therapy: Patient Spontanous Breathing  Post-op Assessment: Report given to RN, Post -op Vital signs reviewed and stable and Patient moving all extremities X 4  Post vital signs: Reviewed and stable  Last Vitals:  Vitals:   10/17/16 0556 10/17/16 0622  BP: (!) 169/118 (!) 159/107  Pulse: (!) 56   Resp: 18   Temp: 36.8 C   SpO2: 99%     Last Pain: There were no vitals filed for this visit.       Complications: No apparent anesthesia complications

## 2016-10-17 NOTE — Op Note (Signed)
    NAME: Chad Adkins.    MRN: [id number] DOB: Jul 17, 1965    DATE OF OPERATION: 10/17/2016  PREOP DIAGNOSIS:    Stage 4 CKD  POSTOP DIAGNOSIS:    Same  PROCEDURE:    First stage right basilic vein transposition  SURGEON: Judeth Cornfield. Scot Dock, MD, FACS  ASSIST: Leontine Locket, PA  ANESTHESIA: Local with sedation   EBL: Minimal  INDICATIONS:    Chad Adkins. is a 51 y.o. male who is not yet on dialysis. He presents for placement of a fistula. The plan was to not place a graft if the vein was not adequate.  FINDINGS:    By duplex the forearm cephalic vein upper arm cephalic vein was very small. There were also multiple branches in the forearm cephalic vein. The basilic vein was marginal in size emptied into the brachial system early. I felt the best option was a first stage basilic vein transposition. Of note in his left arm, he had thrombus in the upper arm cephalic vein.  TECHNIQUE:    The patient was taken to the operative room and sedated by anesthesia. The right upper extremity was prepped and draped in usual sterile fashion. After the skin was anesthetized with 1% lidocaine, a longitudinal incision was made over the basilic vein. This was dissected free with branches divided between clips and 3-0 silk ties. Through the distal incision the brachial artery was dissected free beneath the fascia. The patient was heparinized. The brachial artery was clamped proximally and distally and a longitudinal arteriotomy was made. The basilic vein had been ligated distally and spatulated proximal. It was sewn end-to-side to the artery using continuous 6-0 Prolene suture. At the completion was a good thrill in the fistula and a palpable radial pulse. The heparin was partially reversed with protamine. The wound was closed with deep 3-0 Vicryl and skin closed with 4-0 Vicryl. Dermabond was applied. Patient tolerated the procedure well and was transferred to the recovery room in  stable condition. All needle and sponge counts were correct.  [name], MD, FACS [facility]  DATE OF DICTATION:   10/17/2016

## 2016-10-17 NOTE — Discharge Instructions (Signed)
° °  [facility]  Discharge Instructions  AV Fistula or Graft Surgery for Dialysis Access  Please refer to the following instructions for your post-procedure care. Your surgeon or physician assistant will discuss any changes with you.  Activity  You may drive the day following your surgery, if you are comfortable and no longer taking prescription pain medication. Resume full activity as the soreness in your incision resolves.  Bathing/Showering  You may shower after you go home. Keep your incision dry for 48 hours. Do not soak in a bathtub, hot tub, or swim until the incision heals completely. You may not shower if you have a hemodialysis catheter.  Incision Care  Clean your incision with mild soap and water after 48 hours. Pat the area dry with a clean towel. You do not need a bandage unless otherwise instructed. Do not apply any ointments or creams to your incision. You may have skin glue on your incision. Do not peel it off. It will come off on its own in about one week. Your arm may swell a bit after surgery. To reduce swelling use pillows to elevate your arm so it is above your heart. Your doctor will tell you if you need to lightly wrap your arm with an ACE bandage.  Diet  Resume your normal diet. There are not special food restrictions following this procedure. In order to heal from your surgery, it is CRITICAL to get adequate nutrition. Your body requires vitamins, minerals, and protein. Vegetables are the best source of vitamins and minerals. Vegetables also provide the perfect balance of protein. Processed food has little nutritional value, so try to avoid this.  Medications  Resume taking all of your medications. If your incision is causing pain, you may take over-the counter pain relievers such as acetaminophen (Tylenol). If you were prescribed a stronger pain medication, please be aware these medications can cause nausea and constipation. Prevent  nausea by taking the medication with a snack or meal. Avoid constipation by drinking plenty of fluids and eating foods with high amount of fiber, such as fruits, vegetables, and grains. Do not take Tylenol if you are taking prescription pain medications.  Follow up Your surgeon may want to see you in the office following your access surgery. If so, this will be arranged at the time of your surgery.  Please call us immediately for any of the following conditions:  Increased pain, redness, drainage (pus) from your incision site Fever of 101 degrees or higher Severe or worsening pain at your incision site Hand pain or numbness.  Reduce your risk of vascular disease:  Stop smoking. If you would like help, call [facility] at 1-800-QUIT-NOW 618-151-3004) or [place] at [place] your cholesterol Maintain a desired weight Control your diabetes Keep your blood pressure down  Dialysis  It will take several weeks to several months for your new dialysis access to be ready for use. Your surgeon will determine when it is OK to use it. Your nephrologist will continue to direct your dialysis. You can continue to use your Permcath until your new access is ready for use.   10/17/2016 Chad Adkins [id number] [birth year]  Surgeon(s): Angelia Mould, MD  Procedure(s): ARTERIOVENOUS (AV) FISTULA CREATION RIGHT ARM  x Do not stick fistula for 12 weeks    If you have any questions, please call the office at [phone].

## 2016-10-17 NOTE — Anesthesia Postprocedure Evaluation (Signed)
Anesthesia Post Note  Patient: Chad Adkins.  Procedure(s) Performed: Procedure(s) (LRB): ARTERIOVENOUS (AV) FISTULA CREATION RIGHT ARM (Right)     Patient location during evaluation: PACU Anesthesia Type: MAC Level of consciousness: awake and alert Pain management: pain level controlled Vital Signs Assessment: post-procedure vital signs reviewed and stable Respiratory status: spontaneous breathing, nonlabored ventilation, respiratory function stable and patient connected to nasal cannula oxygen Cardiovascular status: stable and blood pressure returned to baseline Anesthetic complications: no    Last Vitals:  Vitals:   10/17/16 0922 10/17/16 0923  BP: (!) 131/94   Pulse: (!) 57   Resp: 14   Temp:  36.5 C  SpO2: 100%     Last Pain: There were no vitals filed for this visit.               Elly Haffey,JAMES TERRILL

## 2016-10-18 ENCOUNTER — Encounter (HOSPITAL_COMMUNITY): Payer: Self-pay | Admitting: Vascular Surgery

## 2016-10-19 ENCOUNTER — Telehealth: Payer: Self-pay | Admitting: Vascular Surgery

## 2016-10-19 DIAGNOSIS — Z01818 Encounter for other preprocedural examination: Secondary | ICD-10-CM | POA: Insufficient documentation

## 2016-10-19 NOTE — Telephone Encounter (Signed)
Sched appt 12/06/16; lab at 1:00 and MD at 2:00. Spoke to pt.

## 2016-10-19 NOTE — Telephone Encounter (Signed)
-----   Message from Mena Goes, RN sent at 10/17/2016  9:04 AM EDT ----- Regarding: 6-8 weeks w/ duplex   ----- Message ----- From: Angelia Mould, MD Sent: 10/17/2016   9:01 AM To: [facility] Charge Pool Subject: charge                                          PROCEDURE:   First stage right basilic vein transposition  SURGEON: Judeth Cornfield. Scot Dock, MD, FACS  ASSIST: Leontine Locket, PA  He will need a duplex of his fistula in 6-8 weeks. He will need an office visit at that time. Thank you. CD

## 2016-11-14 LAB — CBC AND DIFFERENTIAL
HEMATOCRIT: 25 — AB (ref 41–53)
HEMOGLOBIN: 8.3 — AB (ref 13.5–17.5)
Neutrophils Absolute: 2
PLATELETS: 178 (ref 150–399)
WBC: 4

## 2016-11-14 LAB — BASIC METABOLIC PANEL
BUN: 104 — AB (ref 4–21)
Creatinine: 13.1 — AB (ref 0.6–1.3)
GLUCOSE: 88
POTASSIUM: 5.2 (ref 3.4–5.3)
Sodium: 143 (ref 137–147)

## 2016-11-22 ENCOUNTER — Other Ambulatory Visit: Payer: Self-pay

## 2016-11-22 DIAGNOSIS — N185 Chronic kidney disease, stage 5: Secondary | ICD-10-CM

## 2016-11-22 DIAGNOSIS — Z48812 Encounter for surgical aftercare following surgery on the circulatory system: Secondary | ICD-10-CM

## 2016-11-24 NOTE — Discharge Instructions (Signed)
Epoetin Alfa injection °What is this medicine? °EPOETIN ALFA (e POE e tin AL fa) helps your body make more red blood cells. This medicine is used to treat anemia caused by chronic kidney failure, cancer chemotherapy, or HIV-therapy. It may also be used before surgery if you have anemia. °This medicine may be used for other purposes; ask your health care provider or pharmacist if you have questions. °COMMON BRAND NAME(S): Epogen, Procrit °What should I tell my health care provider before I take this medicine? °They need to know if you have any of these conditions: °-blood clotting disorders °-cancer patient not on chemotherapy °-cystic fibrosis °-heart disease, such as angina or heart failure °-hemoglobin level of 12 g/dL or greater °-high blood pressure °-low levels of folate, iron, or vitamin B12 °-seizures °-an unusual or allergic reaction to erythropoietin, albumin, benzyl alcohol, hamster proteins, other medicines, foods, dyes, or preservatives °-pregnant or trying to get pregnant °-breast-feeding °How should I use this medicine? °This medicine is for injection into a vein or under the skin. It is usually given by a health care professional in a hospital or clinic setting. °If you get this medicine at home, you will be taught how to prepare and give this medicine. Use exactly as directed. Take your medicine at regular intervals. Do not take your medicine more often than directed. °It is important that you put your used needles and syringes in a special sharps container. Do not put them in a trash can. If you do not have a sharps container, call your pharmacist or healthcare provider to get one. °A special MedGuide will be given to you by the pharmacist with each prescription and refill. Be sure to read this information carefully each time. °Talk to your pediatrician regarding the use of this medicine in children. While this drug may be prescribed for selected conditions, precautions do apply. °Overdosage: If you  think you have taken too much of this medicine contact a poison control center or emergency room at once. °NOTE: This medicine is only for you. Do not share this medicine with others. °What if I miss a dose? °If you miss a dose, take it as soon as you can. If it is almost time for your next dose, take only that dose. Do not take double or extra doses. °What may interact with this medicine? °Do not take this medicine with any of the following medications: °-darbepoetin alfa °This list may not describe all possible interactions. Give your health care provider a list of all the medicines, herbs, non-prescription drugs, or dietary supplements you use. Also tell them if you smoke, drink alcohol, or use illegal drugs. Some items may interact with your medicine. °What should I watch for while using this medicine? °Your condition will be monitored carefully while you are receiving this medicine. °You may need blood work done while you are taking this medicine. °What side effects may I notice from receiving this medicine? °Side effects that you should report to your doctor or health care professional as soon as possible: °-allergic reactions like skin rash, itching or hives, swelling of the face, lips, or tongue °-breathing problems °-changes in vision °-chest pain °-confusion, trouble speaking or understanding °-feeling faint or lightheaded, falls °-high blood pressure °-muscle aches or pains °-pain, swelling, warmth in the leg °-rapid weight gain °-severe headaches °-sudden numbness or weakness of the face, arm or leg °-trouble walking, dizziness, loss of balance or coordination °-seizures (convulsions) °-swelling of the ankles, feet, hands °-unusually weak or tired °  Side effects that usually do not require medical attention (report to your doctor or health care professional if they continue or are bothersome): °-diarrhea °-fever, chills (flu-like symptoms) °-headaches °-nausea, vomiting °-redness, stinging, or swelling at  site where injected °This list may not describe all possible side effects. Call your doctor for medical advice about side effects. You may report side effects to FDA at 1-800-FDA-1088. °Where should I keep my medicine? °Keep out of the reach of children. °Store in a refrigerator between 2 and 8 degrees C (36 and 46 degrees F). Do not freeze or shake. Throw away any unused portion if using a single-dose vial. Multi-dose vials can be kept in the refrigerator for up to 21 days after the initial dose. Throw away unused medicine. °NOTE: This sheet is a summary. It may not cover all possible information. If you have questions about this medicine, talk to your doctor, pharmacist, or health care provider. °© 2018 Elsevier/Gold Standard (2015-09-27 19:42:31) ° °

## 2016-11-27 ENCOUNTER — Encounter: Payer: Self-pay | Admitting: Internal Medicine

## 2016-11-27 ENCOUNTER — Ambulatory Visit (HOSPITAL_COMMUNITY)
Admission: RE | Admit: 2016-11-27 | Discharge: 2016-11-27 | Disposition: A | Discharge status: Home / Self Care | Payer: BLUE CROSS/BLUE SHIELD | Source: Ambulatory Visit | Attending: Nephrology | Admitting: Nephrology

## 2016-11-27 DIAGNOSIS — N185 Chronic kidney disease, stage 5: Secondary | ICD-10-CM | POA: Diagnosis present

## 2016-11-27 LAB — IRON AND TIBC
Iron: 65 ug/dL (ref 45–182)
SATURATION RATIOS: 21 % (ref 17.9–39.5)
TIBC: 312 ug/dL (ref 250–450)
UIBC: 247 ug/dL

## 2016-11-27 LAB — FERRITIN: Ferritin: 170 ng/mL (ref 24–336)

## 2016-11-27 MED ORDER — EPOETIN ALFA 10000 UNIT/ML IJ SOLN
5000.0000 [IU] | INTRAMUSCULAR | Status: DC
  Administered 2016-11-27: 5000 [IU] via SUBCUTANEOUS

## 2016-11-27 MED ORDER — EPOETIN ALFA 10000 UNIT/ML IJ SOLN
INTRAMUSCULAR | Status: AC
  Filled 2016-11-27: qty 1

## 2016-11-28 LAB — POCT HEMOGLOBIN-HEMACUE: HEMOGLOBIN: 8.8 g/dL — AB (ref 13.0–17.0)

## 2016-12-04 ENCOUNTER — Encounter (HOSPITAL_COMMUNITY)
Admission: RE | Admit: 2016-12-04 | Discharge: 2016-12-04 | Disposition: A | Discharge status: Home / Self Care | Payer: BLUE CROSS/BLUE SHIELD | Source: Ambulatory Visit | Attending: Nephrology | Admitting: Nephrology

## 2016-12-04 DIAGNOSIS — D631 Anemia in chronic kidney disease: Secondary | ICD-10-CM | POA: Diagnosis present

## 2016-12-04 DIAGNOSIS — N185 Chronic kidney disease, stage 5: Secondary | ICD-10-CM | POA: Diagnosis not present

## 2016-12-04 LAB — POCT HEMOGLOBIN-HEMACUE: HEMOGLOBIN: 8.3 g/dL — AB (ref 13.0–17.0)

## 2016-12-04 MED ORDER — EPOETIN ALFA 10000 UNIT/ML IJ SOLN
INTRAMUSCULAR | Status: AC
  Administered 2016-12-04: 5000 [IU] via SUBCUTANEOUS
  Filled 2016-12-04: qty 1

## 2016-12-04 MED ORDER — EPOETIN ALFA 10000 UNIT/ML IJ SOLN
5000.0000 [IU] | INTRAMUSCULAR | Status: DC
Start: 2016-12-04 — End: 2016-12-05
  Administered 2016-12-04: 5000 [IU] via SUBCUTANEOUS

## 2016-12-06 ENCOUNTER — Encounter: Payer: Self-pay | Admitting: Vascular Surgery

## 2016-12-06 ENCOUNTER — Other Ambulatory Visit: Payer: Self-pay | Admitting: *Deleted

## 2016-12-06 ENCOUNTER — Encounter: Payer: Self-pay | Admitting: *Deleted

## 2016-12-06 ENCOUNTER — Ambulatory Visit (HOSPITAL_COMMUNITY)
Admission: RE | Admit: 2016-12-06 | Discharge: 2016-12-06 | Disposition: A | Discharge status: Home / Self Care | Payer: BLUE CROSS/BLUE SHIELD | Source: Ambulatory Visit | Attending: Surgery | Admitting: Surgery

## 2016-12-06 ENCOUNTER — Encounter (HOSPITAL_COMMUNITY): Payer: Self-pay | Admitting: *Deleted

## 2016-12-06 ENCOUNTER — Ambulatory Visit (INDEPENDENT_AMBULATORY_CARE_PROVIDER_SITE_OTHER): Payer: Self-pay | Admitting: Vascular Surgery

## 2016-12-06 VITALS — BP 146/94 | HR 69 | Temp 97.8°F | Resp 18 | Ht 75.0 in | Wt 242.8 lb

## 2016-12-06 DIAGNOSIS — Z48812 Encounter for surgical aftercare following surgery on the circulatory system: Secondary | ICD-10-CM | POA: Insufficient documentation

## 2016-12-06 DIAGNOSIS — R9439 Abnormal result of other cardiovascular function study: Secondary | ICD-10-CM | POA: Diagnosis not present

## 2016-12-06 DIAGNOSIS — N185 Chronic kidney disease, stage 5: Secondary | ICD-10-CM

## 2016-12-06 DIAGNOSIS — N184 Chronic kidney disease, stage 4 (severe): Secondary | ICD-10-CM

## 2016-12-06 LAB — BASIC METABOLIC PANEL
BUN: 136 — AB (ref 4–21)
Creatinine: 16.4 — AB (ref 0.6–1.3)
GLUCOSE: 110
POTASSIUM: 5.3 (ref 3.4–5.3)
SODIUM: 138 (ref 137–147)

## 2016-12-06 LAB — CBC AND DIFFERENTIAL
HEMATOCRIT: 24 — AB (ref 41–53)
Hemoglobin: 7.9 — AB (ref 13.5–17.5)
Neutrophils Absolute: 3
Platelets: 225 (ref 150–399)
WBC: 4.8

## 2016-12-06 NOTE — Progress Notes (Signed)
Pt denies SOB, chest pain, and being under the care of a cardiologist. Pt denies having a stress test and cardiac cath. Requested EKG tracing from [facility]. Pt made aware to stop taking vitamins, fish oil and herbal medications. Do not take any NSAIDs ie: Ibuprofen, Advil, Naproxen (Aleve), BC and Goody Powder. Pt verbalized understanding of all pre-op instructions.

## 2016-12-06 NOTE — Progress Notes (Signed)
  POST OPERATIVE OFFICE NOTE    CC:  F/u for surgery  HPI:  [name] who is s/p right 1st stage basilic vein transposition on 10/17/16 by Dr. Scot Dock.  He presents today for follow up.   He states that he was called about a half hour ago and was told he needs to go on dialysis in the next day or two.  Our office was called to place a catheter tomorrow.  He states that he has just kinda felt bad the past couple of days.  Describes it as his "blood is boiling".  He denies any pain in his hand.  He says his incision has healed.   No Known Allergies  Current Outpatient Prescriptions  Medication Sig Dispense Refill  . amLODipine (NORVASC) 10 MG tablet Take 1 tablet (10 mg total) by mouth daily. 90 tablet 3  . carvedilol (COREG) 25 MG tablet Take 1 tablet (25 mg total) by mouth 2 (two) times daily with a meal. 180 tablet 3  . oxyCODONE-acetaminophen (ROXICET) 5-325 MG tablet Take 1 tablet by mouth every 6 (six) hours as needed for severe pain. 8 tablet 0   No current facility-administered medications for this visit.      ROS:  See HPI  Physical Exam:  Vitals:   12/06/16 1337 12/06/16 1340  BP: (!) 152/98 (!) 146/94  Pulse: 69   Resp: 18   Temp: 97.8 F (36.6 C)   SpO2: 100%    Vitals:   12/06/16 1337  Weight: 242 lb 12.8 oz (110.1 kg)  Height: 6\' 3"  (1.905 m)    Incision:  Well healed Extremities:  Easily palpable right radial pulse; easily palpable thrill and there is a bruit.  His grip is strong.  Dialysis duplex 12/06/16: Diameter: 0.48cm-0.75cm Depth:  0.44cm-0.70cm   Assessment/Plan:  [name] who is s/p: First stage right basilic vein transposition  -pt fistula is maturing-there is one area in the MUA that hasn't matured like the rest of the fistula-given that he is only 6 weeks out, will go ahead with 2nd stage BVT tomorrow.  He will also need a TDC given he is to go on dialysis in the next couple of days.   Chad Locket,  PA-C [facility] [phone]  Clinic MD:  Scot Dock  I have interviewed the patient and examined the patient. I agree with the findings by the PA. He is in eminent need of dialysis and we have been asked to place a catheter ASAP. Therefore I put him on the schedule tomorrow for placement of a tunneled dialysis catheter and a second stage basilic vein transposition in the right arm. The vein appears to be maturing reasonably well.  Gae Gallop, MD [phone]

## 2016-12-07 ENCOUNTER — Encounter (HOSPITAL_COMMUNITY): Payer: Self-pay

## 2016-12-07 ENCOUNTER — Ambulatory Visit (HOSPITAL_COMMUNITY): Payer: BLUE CROSS/BLUE SHIELD | Admitting: Anesthesiology

## 2016-12-07 ENCOUNTER — Encounter (HOSPITAL_COMMUNITY)
Admission: RE | Disposition: A | Discharge status: Home / Self Care | Payer: Self-pay | Source: Ambulatory Visit | Attending: Vascular Surgery

## 2016-12-07 ENCOUNTER — Ambulatory Visit (HOSPITAL_COMMUNITY): Payer: BLUE CROSS/BLUE SHIELD

## 2016-12-07 ENCOUNTER — Ambulatory Visit (HOSPITAL_COMMUNITY)
Admission: RE | Admit: 2016-12-07 | Discharge: 2016-12-07 | Disposition: A | Discharge status: Home / Self Care | Payer: BLUE CROSS/BLUE SHIELD | Source: Ambulatory Visit | Attending: Vascular Surgery | Admitting: Vascular Surgery

## 2016-12-07 DIAGNOSIS — Z992 Dependence on renal dialysis: Secondary | ICD-10-CM

## 2016-12-07 DIAGNOSIS — D631 Anemia in chronic kidney disease: Secondary | ICD-10-CM | POA: Insufficient documentation

## 2016-12-07 DIAGNOSIS — N186 End stage renal disease: Secondary | ICD-10-CM | POA: Insufficient documentation

## 2016-12-07 DIAGNOSIS — F17210 Nicotine dependence, cigarettes, uncomplicated: Secondary | ICD-10-CM | POA: Diagnosis not present

## 2016-12-07 DIAGNOSIS — Z95828 Presence of other vascular implants and grafts: Secondary | ICD-10-CM

## 2016-12-07 DIAGNOSIS — N185 Chronic kidney disease, stage 5: Secondary | ICD-10-CM | POA: Diagnosis not present

## 2016-12-07 DIAGNOSIS — I120 Hypertensive chronic kidney disease with stage 5 chronic kidney disease or end stage renal disease: Secondary | ICD-10-CM | POA: Insufficient documentation

## 2016-12-07 HISTORY — PX: INSERTION OF DIALYSIS CATHETER: SHX1324

## 2016-12-07 HISTORY — PX: BASCILIC VEIN TRANSPOSITION: SHX5742

## 2016-12-07 LAB — BASIC METABOLIC PANEL
Anion gap: 15 (ref 5–15)
BUN: 136 mg/dL — AB (ref 6–20)
CHLORIDE: 107 mmol/L (ref 101–111)
CO2: 16 mmol/L — AB (ref 22–32)
CREATININE: 16.2 mg/dL — AB (ref 0.61–1.24)
Calcium: 6.6 mg/dL — ABNORMAL LOW (ref 8.9–10.3)
GFR calc Af Amer: 3 mL/min — ABNORMAL LOW (ref >60)
GFR calc non Af Amer: 3 mL/min — ABNORMAL LOW (ref >60)
Glucose, Bld: 87 mg/dL (ref 65–99)
POTASSIUM: 4.9 mmol/L (ref 3.5–5.1)
SODIUM: 138 mmol/L (ref 135–145)

## 2016-12-07 LAB — POCT I-STAT 4, (NA,K, GLUC, HGB,HCT)
GLUCOSE: 88 mg/dL (ref 65–99)
Glucose, Bld: 88 mg/dL (ref 65–99)
HCT: 25 % — ABNORMAL LOW (ref 39.0–52.0)
HEMATOCRIT: 26 % — AB (ref 39.0–52.0)
HEMOGLOBIN: 8.8 g/dL — AB (ref 13.0–17.0)
Hemoglobin: 8.5 g/dL — ABNORMAL LOW (ref 13.0–17.0)
POTASSIUM: 6.2 mmol/L — AB (ref 3.5–5.1)
Potassium: 6.2 mmol/L — ABNORMAL HIGH (ref 3.5–5.1)
SODIUM: 139 mmol/L (ref 135–145)
SODIUM: 140 mmol/L (ref 135–145)

## 2016-12-07 SURGERY — INSERTION OF DIALYSIS CATHETER
Anesthesia: Monitor Anesthesia Care | Site: Chest | Laterality: Right

## 2016-12-07 MED ORDER — LIDOCAINE-EPINEPHRINE (PF) 1 %-1:200000 IJ SOLN
INTRAMUSCULAR | Status: AC
  Filled 2016-12-07: qty 30

## 2016-12-07 MED ORDER — FENTANYL CITRATE (PF) 100 MCG/2ML IJ SOLN
25.0000 ug | INTRAMUSCULAR | Status: DC | PRN
  Administered 2016-12-07 (×3): 50 ug via INTRAVENOUS

## 2016-12-07 MED ORDER — PROTAMINE SULFATE 10 MG/ML IV SOLN
INTRAVENOUS | Status: AC
  Filled 2016-12-07: qty 5

## 2016-12-07 MED ORDER — OXYCODONE-ACETAMINOPHEN 5-325 MG PO TABS
1.0000 | ORAL_TABLET | Freq: Once | ORAL | Status: AC
  Administered 2016-12-07: 2 via ORAL

## 2016-12-07 MED ORDER — MIDAZOLAM HCL 2 MG/2ML IJ SOLN
INTRAMUSCULAR | Status: AC
  Filled 2016-12-07: qty 2

## 2016-12-07 MED ORDER — FUROSEMIDE 10 MG/ML IJ SOLN
30.0000 mg | Freq: Once | INTRAMUSCULAR | Status: AC
  Administered 2016-12-07: 30 mg via INTRAVENOUS
  Filled 2016-12-07: qty 4

## 2016-12-07 MED ORDER — PROMETHAZINE HCL 25 MG/ML IJ SOLN: 6.2500 mg | INTRAMUSCULAR | Status: DC | PRN

## 2016-12-07 MED ORDER — HEPARIN SODIUM (PORCINE) 1000 UNIT/ML IJ SOLN
INTRAMUSCULAR | Status: DC | PRN
  Administered 2016-12-07: 3400 [IU] via INTRAVENOUS

## 2016-12-07 MED ORDER — OXYCODONE-ACETAMINOPHEN 5-325 MG PO TABS: 1.0000 | ORAL_TABLET | ORAL | 0 refills | Status: DC | PRN

## 2016-12-07 MED ORDER — MIDAZOLAM HCL 5 MG/5ML IJ SOLN
INTRAMUSCULAR | Status: DC | PRN
  Administered 2016-12-07: 2 mg via INTRAVENOUS

## 2016-12-07 MED ORDER — CHLORHEXIDINE GLUCONATE 4 % EX LIQD: 60.0000 mL | Freq: Once | CUTANEOUS | Status: DC

## 2016-12-07 MED ORDER — ONDANSETRON HCL 4 MG/2ML IJ SOLN
INTRAMUSCULAR | Status: AC
  Filled 2016-12-07: qty 2

## 2016-12-07 MED ORDER — SODIUM CHLORIDE 0.9 % IV SOLN
INTRAVENOUS | Status: DC | PRN
  Administered 2016-12-07: 500 mL

## 2016-12-07 MED ORDER — 0.9 % SODIUM CHLORIDE (POUR BTL) OPTIME
TOPICAL | Status: DC | PRN
  Administered 2016-12-07: 1000 mL

## 2016-12-07 MED ORDER — LIDOCAINE 2% (20 MG/ML) 5 ML SYRINGE
INTRAMUSCULAR | Status: AC
  Filled 2016-12-07: qty 5

## 2016-12-07 MED ORDER — HEPARIN SODIUM (PORCINE) 1000 UNIT/ML IJ SOLN
INTRAMUSCULAR | Status: DC | PRN
  Administered 2016-12-07: 9000 [IU] via INTRAVENOUS

## 2016-12-07 MED ORDER — LIDOCAINE HCL 1 % IJ SOLN
INTRAMUSCULAR | Status: AC
  Filled 2016-12-07: qty 20

## 2016-12-07 MED ORDER — PROPOFOL 10 MG/ML IV BOLUS
INTRAVENOUS | Status: DC | PRN
  Administered 2016-12-07: 50 mg via INTRAVENOUS
  Administered 2016-12-07: 250 mg via INTRAVENOUS

## 2016-12-07 MED ORDER — FENTANYL CITRATE (PF) 100 MCG/2ML IJ SOLN
INTRAMUSCULAR | Status: DC | PRN
  Administered 2016-12-07: 25 ug via INTRAVENOUS
  Administered 2016-12-07 (×4): 50 ug via INTRAVENOUS
  Administered 2016-12-07: 25 ug via INTRAVENOUS

## 2016-12-07 MED ORDER — FENTANYL CITRATE (PF) 100 MCG/2ML IJ SOLN
INTRAMUSCULAR | Status: AC
  Filled 2016-12-07: qty 2

## 2016-12-07 MED ORDER — OXYCODONE-ACETAMINOPHEN 5-325 MG PO TABS
ORAL_TABLET | ORAL | Status: AC
  Administered 2016-12-07: 2 via ORAL
  Filled 2016-12-07: qty 2

## 2016-12-07 MED ORDER — PROPOFOL 10 MG/ML IV BOLUS
INTRAVENOUS | Status: AC
  Filled 2016-12-07: qty 40

## 2016-12-07 MED ORDER — PROTAMINE SULFATE 10 MG/ML IV SOLN
INTRAVENOUS | Status: DC | PRN
  Administered 2016-12-07 (×4): 10 mg via INTRAVENOUS

## 2016-12-07 MED ORDER — GLYCOPYRROLATE 0.2 MG/ML IJ SOLN
INTRAMUSCULAR | Status: DC | PRN
  Administered 2016-12-07: 0.2 mg via INTRAVENOUS

## 2016-12-07 MED ORDER — PHENYLEPHRINE HCL 10 MG/ML IJ SOLN
INTRAMUSCULAR | Status: DC | PRN
  Administered 2016-12-07: 50 ug via INTRAVENOUS
  Administered 2016-12-07 (×2): 40 ug via INTRAVENOUS

## 2016-12-07 MED ORDER — FENTANYL CITRATE (PF) 250 MCG/5ML IJ SOLN
INTRAMUSCULAR | Status: AC
  Filled 2016-12-07: qty 5

## 2016-12-07 MED ORDER — PAPAVERINE HCL 30 MG/ML IJ SOLN
INTRAMUSCULAR | Status: DC | PRN
  Administered 2016-12-07: 60 mg

## 2016-12-07 MED ORDER — CEFUROXIME SODIUM 1.5 G IV SOLR
1.5000 g | INTRAVENOUS | Status: AC
  Administered 2016-12-07: 1.5 g via INTRAVENOUS

## 2016-12-07 MED ORDER — EPHEDRINE SULFATE 50 MG/ML IJ SOLN
INTRAMUSCULAR | Status: DC | PRN
  Administered 2016-12-07: 5 mg via INTRAVENOUS

## 2016-12-07 MED ORDER — ONDANSETRON HCL 4 MG/2ML IJ SOLN
INTRAMUSCULAR | Status: DC | PRN
  Administered 2016-12-07: 4 mg via INTRAVENOUS

## 2016-12-07 MED ORDER — SODIUM CHLORIDE 0.9 % IV SOLN
INTRAVENOUS | Status: DC
  Administered 2016-12-07 (×2): via INTRAVENOUS

## 2016-12-07 MED ORDER — DEXTROSE 5 % IV SOLN
INTRAVENOUS | Status: AC
  Filled 2016-12-07: qty 1.5

## 2016-12-07 MED ORDER — FENTANYL CITRATE (PF) 100 MCG/2ML IJ SOLN
INTRAMUSCULAR | Status: AC
  Administered 2016-12-07: 50 ug via INTRAVENOUS
  Filled 2016-12-07: qty 2

## 2016-12-07 MED ORDER — PAPAVERINE HCL 30 MG/ML IJ SOLN
INTRAMUSCULAR | Status: AC
  Filled 2016-12-07: qty 2

## 2016-12-07 MED ORDER — PHENYLEPHRINE HCL 10 MG/ML IJ SOLN
INTRAVENOUS | Status: DC | PRN
  Administered 2016-12-07: 50 ug/min via INTRAVENOUS

## 2016-12-07 MED ORDER — HEPARIN SODIUM (PORCINE) 1000 UNIT/ML IJ SOLN
INTRAMUSCULAR | Status: AC
  Filled 2016-12-07: qty 1

## 2016-12-07 SURGICAL SUPPLY — 68 items
ARMBAND PINK RESTRICT EXTREMIT (MISCELLANEOUS) ×3 IMPLANT
BAG DECANTER FOR FLEXI CONT (MISCELLANEOUS) ×3 IMPLANT
BIOPATCH RED 1 DISK 7.0 (GAUZE/BANDAGES/DRESSINGS) ×3 IMPLANT
CANISTER SUCT 3000ML PPV (MISCELLANEOUS) ×3 IMPLANT
CANNULA VESSEL 3MM 2 BLNT TIP (CANNULA) ×3 IMPLANT
CATH EMB 4FR 40CM (CATHETERS) ×3 IMPLANT
CATH PALINDROME RT-P 15FX19CM (CATHETERS) IMPLANT
CATH PALINDROME RT-P 15FX23CM (CATHETERS) ×3 IMPLANT
CATH PALINDROME RT-P 15FX28CM (CATHETERS) IMPLANT
CATH PALINDROME RT-P 15FX55CM (CATHETERS) IMPLANT
CHLORAPREP W/TINT 26ML (MISCELLANEOUS) ×3 IMPLANT
CLIP VESOCCLUDE MED 24/CT (CLIP) ×3 IMPLANT
CLIP VESOCCLUDE SM WIDE 24/CT (CLIP) ×3 IMPLANT
COVER PROBE W GEL 5X96 (DRAPES) ×3 IMPLANT
COVER SURGICAL LIGHT HANDLE (MISCELLANEOUS) ×3 IMPLANT
DECANTER SPIKE VIAL GLASS SM (MISCELLANEOUS) ×3 IMPLANT
DERMABOND ADVANCED (GAUZE/BANDAGES/DRESSINGS) ×2
DERMABOND ADVANCED .7 DNX12 (GAUZE/BANDAGES/DRESSINGS) ×4 IMPLANT
DRAPE C-ARM 42X72 X-RAY (DRAPES) ×3 IMPLANT
DRAPE CHEST BREAST 15X10 FENES (DRAPES) ×3 IMPLANT
ELECT CAUTERY BLADE 6.4 (BLADE) ×3 IMPLANT
ELECT REM PT RETURN 9FT ADLT (ELECTROSURGICAL) ×3
ELECTRODE REM PT RTRN 9FT ADLT (ELECTROSURGICAL) ×2 IMPLANT
GAUZE SPONGE 4X4 16PLY XRAY LF (GAUZE/BANDAGES/DRESSINGS) ×6 IMPLANT
GLOVE BIO SURGEON STRL SZ7.5 (GLOVE) ×6 IMPLANT
GLOVE BIOGEL PI IND STRL 7.0 (GLOVE) ×2 IMPLANT
GLOVE BIOGEL PI IND STRL 8 (GLOVE) ×4 IMPLANT
GLOVE BIOGEL PI IND STRL 8.5 (GLOVE) ×2 IMPLANT
GLOVE BIOGEL PI INDICATOR 7.0 (GLOVE) ×1
GLOVE BIOGEL PI INDICATOR 8 (GLOVE) ×2
GLOVE BIOGEL PI INDICATOR 8.5 (GLOVE) ×1
GLOVE ECLIPSE 7.5 STRL STRAW (GLOVE) ×3 IMPLANT
GLOVE SS BIOGEL STRL SZ 8 (GLOVE) ×2 IMPLANT
GLOVE SUPERSENSE BIOGEL SZ 8 (GLOVE) ×1
GLOVE SURG SS PI 7.0 STRL IVOR (GLOVE) ×3 IMPLANT
GOWN STRL REUS W/ TWL LRG LVL3 (GOWN DISPOSABLE) ×4 IMPLANT
GOWN STRL REUS W/ TWL XL LVL3 (GOWN DISPOSABLE) ×6 IMPLANT
GOWN STRL REUS W/TWL LRG LVL3 (GOWN DISPOSABLE) ×2
GOWN STRL REUS W/TWL XL LVL3 (GOWN DISPOSABLE) ×3
GRAFT GORETEX STRT 4-7X45 (Vascular Products) ×3 IMPLANT
KIT BASIN OR (CUSTOM PROCEDURE TRAY) ×3 IMPLANT
KIT ROOM TURNOVER OR (KITS) ×3 IMPLANT
NEEDLE 18GX1X1/2 (RX/OR ONLY) (NEEDLE) ×6 IMPLANT
NEEDLE HYPO 25GX1X1/2 BEV (NEEDLE) ×3 IMPLANT
NS IRRIG 1000ML POUR BTL (IV SOLUTION) ×3 IMPLANT
PACK CV ACCESS (CUSTOM PROCEDURE TRAY) ×3 IMPLANT
PACK SURGICAL SETUP 50X90 (CUSTOM PROCEDURE TRAY) ×3 IMPLANT
PAD ARMBOARD 7.5X6 YLW CONV (MISCELLANEOUS) ×6 IMPLANT
PENCIL BUTTON HOLSTER BLD 10FT (ELECTRODE) ×3 IMPLANT
SPONGE SURGIFOAM ABS GEL 100 (HEMOSTASIS) IMPLANT
SUT ETHILON 3 0 PS 1 (SUTURE) ×3 IMPLANT
SUT PROLENE 6 0 BV (SUTURE) ×6 IMPLANT
SUT SILK 2 0 SH (SUTURE) IMPLANT
SUT SILK 3 0 (SUTURE) ×1
SUT SILK 3-0 18XBRD TIE 12 (SUTURE) ×2 IMPLANT
SUT VIC AB 3-0 SH 27 (SUTURE) ×3
SUT VIC AB 3-0 SH 27X BRD (SUTURE) ×6 IMPLANT
SUT VIC AB 4-0 PS2 27 (SUTURE) ×6 IMPLANT
SUT VICRYL 4-0 PS2 18IN ABS (SUTURE) ×3 IMPLANT
SYR 10ML LL (SYRINGE) ×3 IMPLANT
SYR 20CC LL (SYRINGE) ×6 IMPLANT
SYR 3ML LL SCALE MARK (SYRINGE) ×6 IMPLANT
SYR 5ML LL (SYRINGE) ×6 IMPLANT
SYR CONTROL 10ML LL (SYRINGE) ×3 IMPLANT
TOWEL GREEN STERILE (TOWEL DISPOSABLE) ×3 IMPLANT
TOWEL GREEN STERILE FF (TOWEL DISPOSABLE) ×3 IMPLANT
UNDERPAD 30X30 (UNDERPADS AND DIAPERS) ×3 IMPLANT
WATER STERILE IRR 1000ML POUR (IV SOLUTION) ×3 IMPLANT

## 2016-12-07 NOTE — H&P (View-Only) (Signed)
  POST OPERATIVE OFFICE NOTE    CC:  F/u for surgery  HPI:  [name] who is s/p right 1st stage basilic vein transposition on 10/17/16 by Dr. Scot Dock.  He presents today for follow up.   He states that he was called about a half hour ago and was told he needs to go on dialysis in the next day or two.  Our office was called to place a catheter tomorrow.  He states that he has just kinda felt bad the past couple of days.  Describes it as his "blood is boiling".  He denies any pain in his hand.  He says his incision has healed.   No Known Allergies  Current Outpatient Prescriptions  Medication Sig Dispense Refill  . amLODipine (NORVASC) 10 MG tablet Take 1 tablet (10 mg total) by mouth daily. 90 tablet 3  . carvedilol (COREG) 25 MG tablet Take 1 tablet (25 mg total) by mouth 2 (two) times daily with a meal. 180 tablet 3  . oxyCODONE-acetaminophen (ROXICET) 5-325 MG tablet Take 1 tablet by mouth every 6 (six) hours as needed for severe pain. 8 tablet 0   No current facility-administered medications for this visit.      ROS:  See HPI  Physical Exam:  Vitals:   12/06/16 1337 12/06/16 1340  BP: (!) 152/98 (!) 146/94  Pulse: 69   Resp: 18   Temp: 97.8 F (36.6 C)   SpO2: 100%    Vitals:   12/06/16 1337  Weight: 242 lb 12.8 oz (110.1 kg)  Height: 6\' 3"  (1.905 m)    Incision:  Well healed Extremities:  Easily palpable right radial pulse; easily palpable thrill and there is a bruit.  His grip is strong.  Dialysis duplex 12/06/16: Diameter: 0.48cm-0.75cm Depth:  0.44cm-0.70cm   Assessment/Plan:  [name] who is s/p: First stage right basilic vein transposition  -pt fistula is maturing-there is one area in the MUA that hasn't matured like the rest of the fistula-given that he is only 6 weeks out, will go ahead with 2nd stage BVT tomorrow.  He will also need a TDC given he is to go on dialysis in the next couple of days.   Leontine Locket,  PA-C [facility] [phone]  Clinic MD:  Scot Dock  I have interviewed the patient and examined the patient. I agree with the findings by the PA. He is in eminent need of dialysis and we have been asked to place a catheter ASAP. Therefore I put him on the schedule tomorrow for placement of a tunneled dialysis catheter and a second stage basilic vein transposition in the right arm. The vein appears to be maturing reasonably well.  Gae Gallop, MD [phone]

## 2016-12-07 NOTE — Interval H&P Note (Signed)
History and Physical Interval Note:  12/07/2016 12:42 PM  Chad Adkins.  has presented today for surgery, with the diagnosis of end stage renal disease  The various methods of treatment have been discussed with the patient and family. After consideration of risks, benefits and other options for treatment, the patient has consented to  Procedure(s): INSERTION OF DIALYSIS CATHETER (N/A) 2ND STAGE [name] (N/A) as a surgical intervention .  The patient's history has been reviewed, patient examined, no change in status, stable for surgery.  I have reviewed the patient's chart and labs.  Questions were answered to the patient's satisfaction.     [name]

## 2016-12-07 NOTE — Transfer of Care (Signed)
Immediate Anesthesia Transfer of Care Note  Patient: Chad Adkins.  Procedure(s) Performed: INSERTION OF right internal jugular DIALYSIS CATHETER with ultrasound guided access (Right Chest) right arm 2ND STAGE BASCILIC VEIN TRANSPOSITION with revision and interposition gortex PTFE graft (Right Arm Upper)  Patient Location: PACU  Anesthesia Type:General  Level of Consciousness: awake, alert  and oriented  Airway & Oxygen Therapy: Patient Spontanous Breathing and Patient connected to nasal cannula oxygen  Post-op Assessment: Report given to RN and Post -op Vital signs reviewed and stable  Post vital signs: Reviewed and stable  Last Vitals:  Vitals:   12/07/16 1250 12/07/16 1633  BP: (!) 157/101 129/78  Pulse:  61  Resp: 13 18  Temp:  36.6 C  SpO2:  97%    Last Pain:  Vitals:   12/07/16 1140  TempSrc: Oral  PainSc: 0-No pain      Patients Stated Pain Goal: 0 (45/[id number])  Complications: No apparent anesthesia complications

## 2016-12-07 NOTE — Anesthesia Preprocedure Evaluation (Signed)
Anesthesia Evaluation  Patient identified by MRN, date of birth, ID band Patient awake    Reviewed: Allergy & Precautions, NPO status , Patient's Chart, lab work & pertinent test results  Airway Mallampati: II  TM Distance: >3 FB Neck ROM: Full    Dental no notable dental hx.    Pulmonary Current Smoker,    Pulmonary exam normal breath sounds clear to auscultation       Cardiovascular hypertension, Normal cardiovascular exam Rhythm:Regular Rate:Normal     Neuro/Psych negative neurological ROS  negative psych ROS   GI/Hepatic negative GI ROS, Neg liver ROS,   Endo/Other  negative endocrine ROS  Renal/GU ESRFRenal disease  negative genitourinary   Musculoskeletal negative musculoskeletal ROS (+)   Abdominal   Peds negative pediatric ROS (+)  Hematology  (+) anemia ,   Anesthesia Other Findings   Reproductive/Obstetrics negative OB ROS                             Anesthesia Physical Anesthesia Plan  ASA: III  Anesthesia Plan: MAC   Post-op Pain Management:    Induction: Intravenous  PONV Risk Score and Plan: 0  Airway Management Planned: Simple Face Mask  Additional Equipment:   Intra-op Plan:   Post-operative Plan:   Informed Consent: I have reviewed the patients History and Physical, chart, labs and discussed the procedure including the risks, benefits and alternatives for the proposed anesthesia with the patient or authorized representative who has indicated his/her understanding and acceptance.   Dental advisory given  Plan Discussed with: CRNA and Surgeon  Anesthesia Plan Comments:         Anesthesia Quick Evaluation

## 2016-12-07 NOTE — Anesthesia Postprocedure Evaluation (Signed)
Anesthesia Post Note  Patient: Chad Adkins.  Procedure(s) Performed: INSERTION OF right internal jugular DIALYSIS CATHETER with ultrasound guided access (Right Chest) right arm 2ND STAGE BASCILIC VEIN TRANSPOSITION with revision and interposition gortex PTFE graft (Right Arm Upper)     Patient location during evaluation: PACU Anesthesia Type: General Level of consciousness: awake and alert Pain management: pain level controlled Vital Signs Assessment: post-procedure vital signs reviewed and stable Respiratory status: spontaneous breathing, nonlabored ventilation, respiratory function stable and patient connected to nasal cannula oxygen Cardiovascular status: blood pressure returned to baseline and stable Postop Assessment: no apparent nausea or vomiting Anesthetic complications: no    Last Vitals:  Vitals:   12/07/16 1645 12/07/16 1700  BP: (!) 126/95 122/90  Pulse: 81 61  Resp: 12 16  Temp:    SpO2: 99% 99%    Last Pain:  Vitals:   12/07/16 1140  TempSrc: Oral  PainSc: 0-No pain                 Genelle Economou S

## 2016-12-07 NOTE — Op Note (Signed)
NAME: Chad Adkins.    MRN: [id number] DOB: 10-11-1965    DATE OF OPERATION: 12/07/2016  PREOP DIAGNOSIS:    Stage V chronic kidney disease  POSTOP DIAGNOSIS:    Same  PROCEDURE:    1. Ultrasound-guided placement of right IJ 23 cm tunnel dialysis catheter 2. Second stage right basilic vein transposition and revision with interposition PTFE graft  SURGEON: Judeth Cornfield. Scot Dock, MD, FACS  ASSIST: Linus Orn, SA  ANESTHESIA: Gen.   EBL: minimal  INDICATIONS:    Chad Adkins. is a 51 y.o. male who had undergone a previous right first stage basilic vein transposition on 10/17/2016.A 6 week follow up duplex, the fistula looked reasonable in size and the plan was to schedule him for a second stage. In the meantime, his kidney function worsened and we are asked to also place a catheter.  FINDINGS:   There was marked intimal hyperplasia in the proximal fistula that was noted after the vein was opened. The only option was to replace the segment with an interposition PTFE graft.  TECHNIQUE:    The patient was taken to the operative room and sedated by anesthesia. He then received a general anesthetic. The neck and upper chest were prepped and draped in usual sterile fashion. Under ultrasound guidance, after the skin was anesthetized, the right IJ was cannulated and a guidewire introduced into the superior vena cava under fluoroscopic control. The tract over the wire was dilated and then a dilator and peel-away sheath were advanced over the wire and the wire and dilator removed. The 23 cm catheter was passed through the peel-away sheath in position and the right atrium. The exit site of the catheter was selected and the skin anesthetized between the 2 areas. Was brought to the tunnel To the appropriate length and the distal ports were attached. Both portseasily flushed and were then flushed with heparinized saline. The catheter was secured at its exit site with a 3-0 nylon  suture. The IJ cannulation site was closed with a 4-0 subcuticular stitch. Sterile dressing was applied.  Attention was then turned to the right arm. The right arm was prepped and draped in usual sterile fashion. By duplex, the basilic vein looked reasonable in size. The previous incision was opened and the basilic vein here was dissected free. It was some inflammation around the vein. Using 2 additional incisions the basilic vein was harvested from the antecubital level to the axilla. Branches were divided between clips and 3-0 silk ties. Once the vein was fully mobilized the patient was heparinized after a tunnel was createdfrom the axillary incision to the antecubital incision. Of note was not a lot of excess vein to allow for a wide tunnel. The graft was brought to the tunnel after was marked to prevent twisting. I had divided the vein at the arterial level. On opening the vein was marked intimal hyperplasia at the proximal fistula.I did not think this was adequate for inflow and therefore had to excise the area of marked intimal hyperplasia. A 4-7 mm PTFE graft was brought to the field. A segment of the 4 mm end was excised and spatulated and sewn end to end to the proximal fistula using continuous 6-0 Prolene suture. The graft was then tailored for anastomosis to the distal end of the graft and this was sewn with continuous 6-0 Prolene suture. I replaced about 5 centimeters of the fistula with PTFE graft. At the completion was a good thrill in the  fistula and a radial signal with the Doppler. The heparin was partially reversed with protamine. The wounds were closed with deep 3-0 Vicryl and the skin closed with 4-0 Vicryl. Dermabond was applied. The patient tolerated the procedure well and was transferred to the recovery room in stable condition. All needle and sponge counts were correct.  [name], MD, FACS [facility]  DATE OF DICTATION:   12/07/2016

## 2016-12-07 NOTE — Progress Notes (Signed)
Dr. Kalman Shan made aware of pt's K+ 6.2 and Hgb 8.8. New orders received.

## 2016-12-08 ENCOUNTER — Telehealth: Payer: Self-pay | Admitting: Vascular Surgery

## 2016-12-08 NOTE — Telephone Encounter (Signed)
-----   Message from Mena Goes, RN sent at 12/08/2016  9:38 AM EDT ----- Regarding: may be duplicate   ----- Message ----- From: Angelia Mould, MD Sent: 12/07/2016   4:28 PM To: Vvs Charge Pool Subject: charge                                          PROCEDURE:   1. Ultrasound-guided placement of right IJ 23 cm tunnel dialysis catheter 2. Second stage right basilic vein transposition and revision with interposition PTFE graft  SURGEON: Judeth Cornfield. Scot Dock, MD, FACS  ASSIST: Linus Orn, SA  He will need a follow up appointment in 6 weeks with a duplex at that time to check on the maturation of his fistula. Thank you. CD

## 2016-12-08 NOTE — Telephone Encounter (Signed)
Sched apppt 01/24/17; lab at 2:30 and MD at 3:45. Spoke to pt.

## 2016-12-11 ENCOUNTER — Inpatient Hospital Stay (HOSPITAL_COMMUNITY): Admission: RE | Admit: 2016-12-11 | Payer: BLUE CROSS/BLUE SHIELD | Source: Ambulatory Visit

## 2016-12-11 ENCOUNTER — Other Ambulatory Visit: Payer: Self-pay

## 2016-12-11 ENCOUNTER — Encounter (HOSPITAL_COMMUNITY): Payer: Self-pay | Admitting: Vascular Surgery

## 2016-12-11 DIAGNOSIS — N185 Chronic kidney disease, stage 5: Secondary | ICD-10-CM

## 2016-12-11 DIAGNOSIS — Z48812 Encounter for surgical aftercare following surgery on the circulatory system: Secondary | ICD-10-CM

## 2016-12-13 ENCOUNTER — Other Ambulatory Visit: Payer: Self-pay | Admitting: *Deleted

## 2016-12-13 DIAGNOSIS — N186 End stage renal disease: Secondary | ICD-10-CM

## 2016-12-18 ENCOUNTER — Encounter: Payer: Self-pay | Admitting: Internal Medicine

## 2017-01-02 ENCOUNTER — Ambulatory Visit (HOSPITAL_COMMUNITY)
Admission: RE | Admit: 2017-01-02 | Discharge: 2017-01-02 | Disposition: A | Discharge status: Home / Self Care | Payer: BLUE CROSS/BLUE SHIELD | Source: Ambulatory Visit | Attending: Vascular Surgery | Admitting: Vascular Surgery

## 2017-01-02 DIAGNOSIS — Z9889 Other specified postprocedural states: Secondary | ICD-10-CM | POA: Insufficient documentation

## 2017-01-02 DIAGNOSIS — Z01810 Encounter for preprocedural cardiovascular examination: Secondary | ICD-10-CM | POA: Diagnosis present

## 2017-01-02 DIAGNOSIS — N186 End stage renal disease: Secondary | ICD-10-CM | POA: Insufficient documentation

## 2017-01-03 ENCOUNTER — Ambulatory Visit: Payer: Self-pay | Admitting: Vascular Surgery

## 2017-01-03 ENCOUNTER — Encounter: Payer: Self-pay | Admitting: Vascular Surgery

## 2017-01-03 ENCOUNTER — Encounter: Payer: Self-pay | Admitting: *Deleted

## 2017-01-03 VITALS — BP 137/90 | HR 67 | Temp 98.7°F | Resp 16 | Ht 75.0 in | Wt 235.0 lb

## 2017-01-03 DIAGNOSIS — N186 End stage renal disease: Secondary | ICD-10-CM

## 2017-01-03 DIAGNOSIS — Z992 Dependence on renal dialysis: Secondary | ICD-10-CM

## 2017-01-03 NOTE — H&P (View-Only) (Signed)
Patient name: Chad Adkins. MRN: [id number] DOB: 09-Jun-1965 Sex: male  REASON FOR VISIT:   To evaluate for new access.  HPI:   Chad Adkins. is a pleasant 51 y.o. male who had a first stage basilic vein transposition on 10/17/2016.  6-week follow-up duplex scan showed that the fistula was reasonable in size and he was scheduled for second stage.  On 12/07/2016 he had placement of a right IJ tunneled dialysis catheter.  In addition he had a second stage basilic vein transposition.  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  This has now occluded.  Patient dialyzes on Tuesdays Thursdays and Saturdays.  He has a right IJ tunneled dialysis catheter.  Current Outpatient Medications  Medication Sig Dispense Refill  . amLODipine (NORVASC) 10 MG tablet Take 1 tablet (10 mg total) by mouth daily. 90 tablet 3  . b complex-vitamin c-folic acid (NEPHRO-VITE) 0.8 MG TABS tablet Take 1 tablet at bedtime by mouth.    . calcium acetate (PHOSLO) 667 MG capsule Take 3 (three) times daily with meals by mouth.    . carvedilol (COREG) 25 MG tablet Take 1 tablet (25 mg total) by mouth 2 (two) times daily with a meal. 180 tablet 3  . furosemide (LASIX) 80 MG tablet Take 80 mg by mouth.    . sodium bicarbonate 650 MG tablet Take 650 mg by mouth 2 (two) times daily.    Marland Kitchen oxyCODONE-acetaminophen (ROXICET) 5-325 MG tablet Take 1-2 tablets by mouth every 4 (four) hours as needed. 20 tablet 0   No current facility-administered medications for this visit.     REVIEW OF SYSTEMS:  [X]  denotes positive finding, [ ]  denotes negative finding Cardiac  Comments:  Chest pain or chest pressure:    Shortness of breath upon exertion:    Short of breath when lying flat:    Irregular heart rhythm:    Constitutional    Fever or chills:     PHYSICAL EXAM:   Vitals:   01/03/17 0932  BP: 137/90  Pulse: 67  Resp: 16  Temp: 98.7 F  (37.1 C)  TempSrc: Oral  SpO2: 99%  Weight: 235 lb (106.6 kg)  Height: 6\' 3"  (1.905 m)    GENERAL: The patient is a well-nourished male, in no acute distress. The vital signs are documented above. CARDIOVASCULAR: There is a regular rate and rhythm. PULMONARY: There is good air exchange bilaterally without wheezing or rales. There is no thrill in the right upper arm fistula. There is a palpable radial pulse bilaterally.    DATA:   No new data  MEDICAL ISSUES:   CLOTTED RIGHT BASILIC VEIN TRANSPOSITION: The patient's right basilic vein transposition has occluded.  I think the next best option for access would be a graft in the right arm.  I did look at the upper arm cephalic vein with the SonoSite today and this confirms the findings on 2 previous duplex scans that this vein is not adequate for a fistula.  We also discussed potentially a basilic vein transposition on the left although given the problems he has had on her right I think would be best to proceed with her graft in the right arm.  We would still have that option potentially in the future.  This surgery is scheduled for 01/12/2017.  We have discussed the procedure and potential complications and he is agreeable to proceed.  [name] [facility] [employer] [facility] [phone]

## 2017-01-03 NOTE — Progress Notes (Signed)
Patient name: Chad Adkins. MRN: [id number] DOB: 11-Oct-1965 Sex: male  REASON FOR VISIT:   To evaluate for new access.  HPI:   Chad Adkins. is a pleasant 51 y.o. male who had a first stage basilic vein transposition on 10/17/2016.  6-week follow-up duplex scan showed that the fistula was reasonable in size and he was scheduled for second stage.  On 12/07/2016 he had placement of a right IJ tunneled dialysis catheter.  In addition he had a second stage basilic vein transposition.  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  This has now occluded.  Patient dialyzes on Tuesdays Thursdays and Saturdays.  He has a right IJ tunneled dialysis catheter.  Current Outpatient Medications  Medication Sig Dispense Refill  . amLODipine (NORVASC) 10 MG tablet Take 1 tablet (10 mg total) by mouth daily. 90 tablet 3  . b complex-vitamin c-folic acid (NEPHRO-VITE) 0.8 MG TABS tablet Take 1 tablet at bedtime by mouth.    . calcium acetate (PHOSLO) 667 MG capsule Take 3 (three) times daily with meals by mouth.    . carvedilol (COREG) 25 MG tablet Take 1 tablet (25 mg total) by mouth 2 (two) times daily with a meal. 180 tablet 3  . furosemide (LASIX) 80 MG tablet Take 80 mg by mouth.    . sodium bicarbonate 650 MG tablet Take 650 mg by mouth 2 (two) times daily.    Marland Kitchen oxyCODONE-acetaminophen (ROXICET) 5-325 MG tablet Take 1-2 tablets by mouth every 4 (four) hours as needed. 20 tablet 0   No current facility-administered medications for this visit.     REVIEW OF SYSTEMS:  [X]  denotes positive finding, [ ]  denotes negative finding Cardiac  Comments:  Chest pain or chest pressure:    Shortness of breath upon exertion:    Short of breath when lying flat:    Irregular heart rhythm:    Constitutional    Fever or chills:     PHYSICAL EXAM:   Vitals:   01/03/17 0932  BP: 137/90  Pulse: 67  Resp: 16  Temp: 98.7 F  (37.1 C)  TempSrc: Oral  SpO2: 99%  Weight: 235 lb (106.6 kg)  Height: 6\' 3"  (1.905 m)    GENERAL: The patient is a well-nourished male, in no acute distress. The vital signs are documented above. CARDIOVASCULAR: There is a regular rate and rhythm. PULMONARY: There is good air exchange bilaterally without wheezing or rales. There is no thrill in the right upper arm fistula. There is a palpable radial pulse bilaterally.    DATA:   No new data  MEDICAL ISSUES:   CLOTTED RIGHT BASILIC VEIN TRANSPOSITION: The patient's right basilic vein transposition has occluded.  I think the next best option for access would be a graft in the right arm.  I did look at the upper arm cephalic vein with the SonoSite today and this confirms the findings on 2 previous duplex scans that this vein is not adequate for a fistula.  We also discussed potentially a basilic vein transposition on the left although given the problems he has had on her right I think would be best to proceed with her graft in the right arm.  We would still have that option potentially in the future.  This surgery is scheduled for 01/12/2017.  We have discussed the procedure and potential complications and he is agreeable to proceed.  [name] [facility] [employer] [facility] [phone]

## 2017-01-04 ENCOUNTER — Other Ambulatory Visit: Payer: Self-pay | Admitting: *Deleted

## 2017-01-09 ENCOUNTER — Other Ambulatory Visit: Payer: Self-pay

## 2017-01-09 ENCOUNTER — Encounter (HOSPITAL_COMMUNITY): Payer: Self-pay | Admitting: *Deleted

## 2017-01-10 MED ORDER — SODIUM CHLORIDE 0.9 % IV SOLN
INTRAVENOUS | Status: DC
  Administered 2017-01-12: 07:00:00 via INTRAVENOUS

## 2017-01-10 MED ORDER — DEXTROSE 5 % IV SOLN
1.5000 g | INTRAVENOUS | Status: AC
  Administered 2017-01-12: 1.5 g via INTRAVENOUS
  Filled 2017-01-10: qty 1.5

## 2017-01-12 ENCOUNTER — Ambulatory Visit (HOSPITAL_COMMUNITY)
Admission: RE | Admit: 2017-01-12 | Discharge: 2017-01-12 | Disposition: A | Discharge status: Home / Self Care | Payer: BLUE CROSS/BLUE SHIELD | Source: Ambulatory Visit | Attending: Vascular Surgery | Admitting: Vascular Surgery

## 2017-01-12 ENCOUNTER — Encounter (HOSPITAL_COMMUNITY)
Admission: RE | Disposition: A | Discharge status: Home / Self Care | Payer: Self-pay | Source: Ambulatory Visit | Attending: Vascular Surgery

## 2017-01-12 ENCOUNTER — Ambulatory Visit (HOSPITAL_COMMUNITY): Payer: BLUE CROSS/BLUE SHIELD | Admitting: Anesthesiology

## 2017-01-12 DIAGNOSIS — N185 Chronic kidney disease, stage 5: Secondary | ICD-10-CM

## 2017-01-12 DIAGNOSIS — Z79899 Other long term (current) drug therapy: Secondary | ICD-10-CM | POA: Diagnosis not present

## 2017-01-12 DIAGNOSIS — N186 End stage renal disease: Secondary | ICD-10-CM | POA: Insufficient documentation

## 2017-01-12 DIAGNOSIS — F17200 Nicotine dependence, unspecified, uncomplicated: Secondary | ICD-10-CM | POA: Diagnosis not present

## 2017-01-12 DIAGNOSIS — Z992 Dependence on renal dialysis: Secondary | ICD-10-CM | POA: Diagnosis not present

## 2017-01-12 DIAGNOSIS — I120 Hypertensive chronic kidney disease with stage 5 chronic kidney disease or end stage renal disease: Secondary | ICD-10-CM | POA: Diagnosis not present

## 2017-01-12 DIAGNOSIS — T82898A Other specified complication of vascular prosthetic devices, implants and grafts, initial encounter: Secondary | ICD-10-CM | POA: Diagnosis not present

## 2017-01-12 DIAGNOSIS — X58XXXA Exposure to other specified factors, initial encounter: Secondary | ICD-10-CM | POA: Diagnosis not present

## 2017-01-12 HISTORY — PX: AV FISTULA PLACEMENT: SHX1204

## 2017-01-12 LAB — POCT I-STAT 4, (NA,K, GLUC, HGB,HCT)
Glucose, Bld: 80 mg/dL (ref 65–99)
HEMATOCRIT: 37 % — AB (ref 39.0–52.0)
HEMOGLOBIN: 12.6 g/dL — AB (ref 13.0–17.0)
POTASSIUM: 4.1 mmol/L (ref 3.5–5.1)
SODIUM: 138 mmol/L (ref 135–145)

## 2017-01-12 SURGERY — INSERTION OF ARTERIOVENOUS (AV) GORE-TEX GRAFT ARM
Anesthesia: Monitor Anesthesia Care | Site: Arm Upper | Laterality: Right

## 2017-01-12 MED ORDER — SUCCINYLCHOLINE CHLORIDE 200 MG/10ML IV SOSY
PREFILLED_SYRINGE | INTRAVENOUS | Status: AC
  Filled 2017-01-12: qty 10

## 2017-01-12 MED ORDER — PROPOFOL 500 MG/50ML IV EMUL
INTRAVENOUS | Status: DC | PRN
  Administered 2017-01-12: 125 ug/kg/min via INTRAVENOUS
  Administered 2017-01-12: 09:00:00 via INTRAVENOUS

## 2017-01-12 MED ORDER — PROTAMINE SULFATE 10 MG/ML IV SOLN
INTRAVENOUS | Status: DC | PRN
  Administered 2017-01-12: 40 mg via INTRAVENOUS

## 2017-01-12 MED ORDER — DEXAMETHASONE SODIUM PHOSPHATE 10 MG/ML IJ SOLN
INTRAMUSCULAR | Status: AC
Start: 2017-01-12 — End: ?
  Filled 2017-01-12: qty 1

## 2017-01-12 MED ORDER — LIDOCAINE-EPINEPHRINE (PF) 1 %-1:200000 IJ SOLN
INTRAMUSCULAR | Status: AC
  Filled 2017-01-12: qty 30

## 2017-01-12 MED ORDER — MIDAZOLAM HCL 2 MG/2ML IJ SOLN
INTRAMUSCULAR | Status: AC
  Filled 2017-01-12: qty 2

## 2017-01-12 MED ORDER — OXYCODONE HCL 5 MG/5ML PO SOLN: 5.0000 mg | Freq: Once | ORAL | Status: AC | PRN

## 2017-01-12 MED ORDER — FENTANYL CITRATE (PF) 100 MCG/2ML IJ SOLN
25.0000 ug | INTRAMUSCULAR | Status: DC | PRN
Start: 2017-01-12 — End: 2017-01-12

## 2017-01-12 MED ORDER — LIDOCAINE-EPINEPHRINE 1 %-1:200000 IJ SOLN
INTRAMUSCULAR | Status: AC
Start: 2017-01-12 — End: ?
  Filled 2017-01-12: qty 30

## 2017-01-12 MED ORDER — OXYCODONE-ACETAMINOPHEN 5-325 MG PO TABS: 1.0000 | ORAL_TABLET | Freq: Four times a day (QID) | ORAL | 0 refills | Status: DC | PRN

## 2017-01-12 MED ORDER — MIDAZOLAM HCL 2 MG/2ML IJ SOLN
INTRAMUSCULAR | Status: DC | PRN
  Administered 2017-01-12: 2 mg via INTRAVENOUS

## 2017-01-12 MED ORDER — SODIUM CHLORIDE 0.9 % IV SOLN
INTRAVENOUS | Status: DC | PRN
  Administered 2017-01-12: 07:00:00

## 2017-01-12 MED ORDER — LIDOCAINE 2% (20 MG/ML) 5 ML SYRINGE
INTRAMUSCULAR | Status: AC
  Filled 2017-01-12: qty 5

## 2017-01-12 MED ORDER — 0.9 % SODIUM CHLORIDE (POUR BTL) OPTIME
TOPICAL | Status: DC | PRN
  Administered 2017-01-12: 1000 mL

## 2017-01-12 MED ORDER — THROMBIN 20000 UNITS EX KIT
PACK | CUTANEOUS | Status: AC
  Filled 2017-01-12: qty 1

## 2017-01-12 MED ORDER — CARVEDILOL 25 MG PO TABS: 25.0000 mg | ORAL_TABLET | Freq: Two times a day (BID) | ORAL | Status: DC

## 2017-01-12 MED ORDER — CHLORHEXIDINE GLUCONATE 4 % EX LIQD: 60.0000 mL | Freq: Once | CUTANEOUS | Status: DC

## 2017-01-12 MED ORDER — ONDANSETRON HCL 4 MG/2ML IJ SOLN: 4.0000 mg | Freq: Once | INTRAMUSCULAR | Status: DC | PRN

## 2017-01-12 MED ORDER — PROPOFOL 10 MG/ML IV BOLUS
INTRAVENOUS | Status: AC
  Filled 2017-01-12: qty 20

## 2017-01-12 MED ORDER — LIDOCAINE-EPINEPHRINE (PF) 1 %-1:200000 IJ SOLN
INTRAMUSCULAR | Status: DC | PRN
  Administered 2017-01-12 (×2): 30 mL

## 2017-01-12 MED ORDER — ROCURONIUM BROMIDE 10 MG/ML (PF) SYRINGE
PREFILLED_SYRINGE | INTRAVENOUS | Status: AC
  Filled 2017-01-12: qty 5

## 2017-01-12 MED ORDER — FENTANYL CITRATE (PF) 250 MCG/5ML IJ SOLN
INTRAMUSCULAR | Status: AC
  Filled 2017-01-12: qty 5

## 2017-01-12 MED ORDER — PHENYLEPHRINE HCL 10 MG/ML IJ SOLN
INTRAMUSCULAR | Status: DC | PRN
  Administered 2017-01-12: 80 ug via INTRAVENOUS
  Administered 2017-01-12 (×2): 120 ug via INTRAVENOUS
  Administered 2017-01-12: 80 ug via INTRAVENOUS

## 2017-01-12 MED ORDER — CARVEDILOL 25 MG PO TABS
25.0000 mg | ORAL_TABLET | Freq: Once | ORAL | Status: DC
  Filled 2017-01-12: qty 1

## 2017-01-12 MED ORDER — PROPOFOL 10 MG/ML IV BOLUS
INTRAVENOUS | Status: DC | PRN
  Administered 2017-01-12: 50 mg via INTRAVENOUS

## 2017-01-12 MED ORDER — FENTANYL CITRATE (PF) 250 MCG/5ML IJ SOLN
INTRAMUSCULAR | Status: DC | PRN
  Administered 2017-01-12 (×4): 25 ug via INTRAVENOUS

## 2017-01-12 MED ORDER — OXYCODONE HCL 5 MG PO TABS
5.0000 mg | ORAL_TABLET | Freq: Once | ORAL | Status: AC | PRN
  Administered 2017-01-12: 5 mg via ORAL

## 2017-01-12 MED ORDER — DEXAMETHASONE SODIUM PHOSPHATE 10 MG/ML IJ SOLN
INTRAMUSCULAR | Status: DC | PRN
  Administered 2017-01-12: 10 mg via INTRAVENOUS

## 2017-01-12 MED ORDER — ONDANSETRON HCL 4 MG/2ML IJ SOLN
INTRAMUSCULAR | Status: DC | PRN
  Administered 2017-01-12: 4 mg via INTRAVENOUS

## 2017-01-12 MED ORDER — HEPARIN SODIUM (PORCINE) 1000 UNIT/ML IJ SOLN
INTRAMUSCULAR | Status: DC | PRN
  Administered 2017-01-12: 9000 [IU] via INTRAVENOUS

## 2017-01-12 MED ORDER — ONDANSETRON HCL 4 MG/2ML IJ SOLN
INTRAMUSCULAR | Status: AC
  Filled 2017-01-12: qty 2

## 2017-01-12 MED ORDER — OXYCODONE HCL 5 MG PO TABS
ORAL_TABLET | ORAL | Status: AC
  Administered 2017-01-12: 5 mg via ORAL
  Filled 2017-01-12: qty 1

## 2017-01-12 MED ORDER — HEPARIN SODIUM (PORCINE) 1000 UNIT/ML IJ SOLN
INTRAMUSCULAR | Status: AC
  Filled 2017-01-12: qty 1

## 2017-01-12 MED ORDER — CARVEDILOL 12.5 MG PO TABS
ORAL_TABLET | ORAL | Status: AC
  Administered 2017-01-12: 25 mg
  Filled 2017-01-12: qty 2

## 2017-01-12 MED ORDER — LIDOCAINE HCL 1 % IJ SOLN
INTRAMUSCULAR | Status: AC
  Filled 2017-01-12: qty 20

## 2017-01-12 SURGICAL SUPPLY — 35 items
ARMBAND PINK RESTRICT EXTREMIT (MISCELLANEOUS) ×2 IMPLANT
CANISTER SUCT 3000ML PPV (MISCELLANEOUS) ×2 IMPLANT
CANNULA VESSEL 3MM 2 BLNT TIP (CANNULA) ×2 IMPLANT
CLIP VESOCCLUDE MED 6/CT (CLIP) ×2 IMPLANT
CLIP VESOCCLUDE SM WIDE 6/CT (CLIP) ×4 IMPLANT
DECANTER SPIKE VIAL GLASS SM (MISCELLANEOUS) ×4 IMPLANT
DERMABOND ADVANCED (GAUZE/BANDAGES/DRESSINGS) ×1
DERMABOND ADVANCED .7 DNX12 (GAUZE/BANDAGES/DRESSINGS) ×1 IMPLANT
ELECT REM PT RETURN 9FT ADLT (ELECTROSURGICAL) ×2
ELECTRODE REM PT RTRN 9FT ADLT (ELECTROSURGICAL) ×1 IMPLANT
GLOVE BIO SURGEON STRL SZ 6.5 (GLOVE) ×4 IMPLANT
GLOVE BIO SURGEON STRL SZ7.5 (GLOVE) ×2 IMPLANT
GLOVE BIOGEL PI IND STRL 6.5 (GLOVE) ×2 IMPLANT
GLOVE BIOGEL PI IND STRL 8 (GLOVE) ×1 IMPLANT
GLOVE BIOGEL PI INDICATOR 6.5 (GLOVE) ×2
GLOVE BIOGEL PI INDICATOR 8 (GLOVE) ×1
GOWN STRL REUS W/ TWL LRG LVL3 (GOWN DISPOSABLE) ×2 IMPLANT
GOWN STRL REUS W/ TWL XL LVL3 (GOWN DISPOSABLE) ×1 IMPLANT
GOWN STRL REUS W/TWL LRG LVL3 (GOWN DISPOSABLE) ×2
GOWN STRL REUS W/TWL XL LVL3 (GOWN DISPOSABLE) ×1
GRAFT GORETEX STRT 4-7X45 (Vascular Products) ×2 IMPLANT
KIT BASIN OR (CUSTOM PROCEDURE TRAY) ×2 IMPLANT
KIT ROOM TURNOVER OR (KITS) ×2 IMPLANT
NS IRRIG 1000ML POUR BTL (IV SOLUTION) ×2 IMPLANT
PACK CV ACCESS (CUSTOM PROCEDURE TRAY) ×2 IMPLANT
PAD ARMBOARD 7.5X6 YLW CONV (MISCELLANEOUS) ×4 IMPLANT
SPONGE SURGIFOAM ABS GEL 100 (HEMOSTASIS) IMPLANT
SUT PROLENE 6 0 BV (SUTURE) ×12 IMPLANT
SUT VIC AB 3-0 SH 27 (SUTURE) ×3
SUT VIC AB 3-0 SH 27X BRD (SUTURE) ×3 IMPLANT
SUT VIC AB 4-0 PS2 18 (SUTURE) ×2 IMPLANT
SUT VICRYL 4-0 PS2 18IN ABS (SUTURE) ×4 IMPLANT
SYR TOOMEY 50ML (SYRINGE) IMPLANT
UNDERPAD 30X30 (UNDERPADS AND DIAPERS) ×2 IMPLANT
WATER STERILE IRR 1000ML POUR (IV SOLUTION) ×2 IMPLANT

## 2017-01-12 NOTE — Anesthesia Preprocedure Evaluation (Addendum)
Anesthesia Evaluation  Patient identified by MRN, date of birth, ID band Patient awake    Reviewed: Allergy & Precautions, NPO status , Patient's Chart, lab work & pertinent test results  Airway Mallampati: II  TM Distance: >3 FB Neck ROM: Full    Dental  (+) Teeth Intact, Dental Advisory Given   Pulmonary Current Smoker,    breath sounds clear to auscultation       Cardiovascular hypertension,  Rhythm:Regular Rate:Normal     Neuro/Psych    GI/Hepatic   Endo/Other    Renal/GU      Musculoskeletal   Abdominal   Peds  Hematology   Anesthesia Other Findings   Reproductive/Obstetrics                            Anesthesia Physical Anesthesia Plan  ASA: III  Anesthesia Plan: MAC   Post-op Pain Management:    Induction: Intravenous  PONV Risk Score and Plan: Ondansetron and Dexamethasone  Airway Management Planned: Natural Airway and Nasal Cannula  Additional Equipment:   Intra-op Plan:   Post-operative Plan:   Informed Consent: I have reviewed the patients History and Physical, chart, labs and discussed the procedure including the risks, benefits and alternatives for the proposed anesthesia with the patient or authorized representative who has indicated his/her understanding and acceptance.   Dental advisory given  Plan Discussed with: CRNA and Anesthesiologist  Anesthesia Plan Comments:         Anesthesia Quick Evaluation

## 2017-01-12 NOTE — Anesthesia Postprocedure Evaluation (Signed)
Anesthesia Post Note  Patient: Chad Adkins.  Procedure(s) Performed: INSERTION OF ARTERIOVENOUS (AV) GORE-TEX STRETCH 4-7MM GRAFT INTO RIGHT ARM (Right Arm Upper)     Patient location during evaluation: PACU Anesthesia Type: MAC Level of consciousness: awake, awake and alert and oriented Pain management: pain level controlled Vital Signs Assessment: post-procedure vital signs reviewed and stable Respiratory status: spontaneous breathing, nonlabored ventilation and respiratory function stable Cardiovascular status: blood pressure returned to baseline Anesthetic complications: no    Last Vitals:  Vitals:   01/12/17 1015 01/12/17 1030  BP: 115/79 124/86  Pulse: 66 65  Resp: 15 11  Temp:    SpO2: 100% 99%    Last Pain:  Vitals:   01/12/17 0930  PainSc: Asleep                 Cerra Eisenhower COKER

## 2017-01-12 NOTE — Anesthesia Procedure Notes (Signed)
Procedure Name: MAC Date/Time: 01/12/2017 7:32 AM Performed by: Lance Coon, CRNA Pre-anesthesia Checklist: Patient identified, Emergency Drugs available, Patient being monitored, Suction available and Timeout performed Patient Re-evaluated:Patient Re-evaluated prior to induction Oxygen Delivery Method: Nasal cannula

## 2017-01-12 NOTE — Transfer of Care (Signed)
Immediate Anesthesia Transfer of Care Note  Patient: Chad Adkins.  Procedure(s) Performed: INSERTION OF ARTERIOVENOUS (AV) GORE-TEX STRETCH 4-7MM GRAFT INTO RIGHT ARM (Right Arm Upper)  Patient Location: PACU  Anesthesia Type:MAC  Level of Consciousness: sedated and patient cooperative  Airway & Oxygen Therapy: Patient Spontanous Breathing and Patient connected to nasal cannula oxygen  Post-op Assessment: Report given to RN and Post -op Vital signs reviewed and stable  Post vital signs: Reviewed and stable  Last Vitals:  Vitals:   01/12/17 0633 01/12/17 0930  BP: (!) 159/102 (!) 107/59  Pulse:  73  Resp:  16  Temp:  36.5 C  SpO2:  96%    Last Pain: There were no vitals filed for this visit.       Complications: No apparent anesthesia complications

## 2017-01-12 NOTE — Discharge Instructions (Signed)
° °  [facility]  Discharge Instructions  AV Fistula or Graft Surgery for Dialysis Access  Please refer to the following instructions for your post-procedure care. Your surgeon or physician assistant will discuss any changes with you.  Activity  You may drive the day following your surgery, if you are comfortable and no longer taking prescription pain medication. Resume full activity as the soreness in your incision resolves.  Bathing/Showering  You may shower after you go home. Keep your incision dry for 48 hours. Do not soak in a bathtub, hot tub, or swim until the incision heals completely. You may not shower if you have a hemodialysis catheter.  Incision Care  Clean your incision with mild soap and water after 48 hours. Pat the area dry with a clean towel. You do not need a bandage unless otherwise instructed. Do not apply any ointments or creams to your incision. You may have skin glue on your incision. Do not peel it off. It will come off on its own in about one week. Your arm may swell a bit after surgery. To reduce swelling use pillows to elevate your arm so it is above your heart. Your doctor will tell you if you need to lightly wrap your arm with an ACE bandage.  Diet  Resume your normal diet. There are not special food restrictions following this procedure. In order to heal from your surgery, it is CRITICAL to get adequate nutrition. Your body requires vitamins, minerals, and protein. Vegetables are the best source of vitamins and minerals. Vegetables also provide the perfect balance of protein. Processed food has little nutritional value, so try to avoid this.  Medications  Resume taking all of your medications. If your incision is causing pain, you may take over-the counter pain relievers such as acetaminophen (Tylenol). If you were prescribed a stronger pain medication, please be aware these medications can cause nausea and constipation. Prevent  nausea by taking the medication with a snack or meal. Avoid constipation by drinking plenty of fluids and eating foods with high amount of fiber, such as fruits, vegetables, and grains. Do not take Tylenol if you are taking prescription pain medications.  Follow up Your surgeon may want to see you in the office following your access surgery. If so, this will be arranged at the time of your surgery.  Please call us immediately for any of the following conditions:  Increased pain, redness, drainage (pus) from your incision site Fever of 101 degrees or higher Severe or worsening pain at your incision site Hand pain or numbness.  Reduce your risk of vascular disease:  Stop smoking. If you would like help, call [facility] at 1-800-QUIT-NOW (551) 882-6507) or [place] at [place] your cholesterol Maintain a desired weight Control your diabetes Keep your blood pressure down  Dialysis  It will take several weeks to several months for your new dialysis access to be ready for use. Your surgeon will determine when it is OK to use it. Your nephrologist will continue to direct your dialysis. You can continue to use your Permcath until your new access is ready for use.   01/12/2017 Chad Adkins [id number] [birth year]  Surgeon(s): Angelia Mould, MD  Procedure(s): INSERTION OF ARTERIOVENOUS (AV) GORE-TEX STRETCH 4-7MM GRAFT INTO RIGHT ARM  x Do not stick graft for 4 weeks    If you have any questions, please call the office at [phone].

## 2017-01-12 NOTE — Op Note (Signed)
    NAME: [name].    MRN: [id number] DOB: 01-06-1966    DATE OF OPERATION: 01/12/2017  PREOP DIAGNOSIS:    End-stage renal disease  POSTOP DIAGNOSIS:    Same  PROCEDURE:    Placement of right upper arm AV graft (4-7 mm PTFE graft)  SURGEON: Judeth Cornfield. Scot Dock, MD, FACS  ASSIST: Leontine Locket, PA  ANESTHESIA: Local with sedation  EBL: Minimal  INDICATIONS:    Chad Adkins. is a 51 y.o. male who presents for new access.  He had a basilic vein transposition as his only real option in the right arm.  He developed marked intimal hyperplasia in the vein and this failed.  He presents for new access.  FINDINGS:   Antecubital veins were small and therefore placed in upper arm graft.  Excellent thrill in the graft at the completion and radial and ulnar signal with the Doppler at the completion of the procedure.  TECHNIQUE:   The patient was taken to the operating room and sedated by anesthesia.  The right upper extremity was prepped and draped in the usual sterile fashion.  A longitudinal incision was made below the antecubital line to avoid the previous anastomosis from the basilic vein transposition.  Here the artery was good size and had a good pulse.  The adjacent veins however were marginal in size.  I elected to place an upper arm graft.  A separate longitudinal incision was made beneath the axilla after the skin was anesthetized.  Here the high brachial vein was dissected free.  A 4-7 mm PTFE graft was then tunneled between the 2 incisions and the patient was heparinized.  The brachial artery was clamped proximally and distally and a longitudinal arteriotomy was made.  A segment of the 4 mm end of the graft was excised, the graft spatulated and sewn end to side to the brachial artery using continuous 6-0 Prolene suture.  The graft was then pulled to the appropriate length for anastomosis to the high brachial vein.  The vein was ligated distally and spatulated  proximally.  The graft was sewn end to end to the vein using continuous 6-0 Prolene suture.  At the completion was a good thrill in the graft.  There was a radial and ulnar signal with a Doppler. The heparin was partially reversed with protamine.  The wounds were then closed with a deep layer of 3-0 Vicryl the skin closed with 4-0 Vicryl.  Dermabond was applied.  The patient tolerated the procedure well and was transferred to the recovery room in stable condition.  All needle and sponge counts were correct.  [name], MD, FACS [facility]  DATE OF DICTATION:   01/12/2017

## 2017-01-12 NOTE — Interval H&P Note (Signed)
History and Physical Interval Note:  01/12/2017 7:18 AM  Chad Adkins.  has presented today for surgery, with the diagnosis of end stage renal disease  The various methods of treatment have been discussed with the patient and family. After consideration of risks, benefits and other options for treatment, the patient has consented to  Procedure(s): INSERTION OF ARTERIOVENOUS (AV) GORE-TEX GRAFT ARM (Right) as a surgical intervention .  The patient's history has been reviewed, patient examined, no change in status, stable for surgery.  I have reviewed the patient's chart and labs.  Questions were answered to the patient's satisfaction.     [name]

## 2017-01-15 ENCOUNTER — Telehealth: Payer: Self-pay | Admitting: Vascular Surgery

## 2017-01-15 ENCOUNTER — Encounter (HOSPITAL_COMMUNITY): Payer: Self-pay | Admitting: Vascular Surgery

## 2017-01-15 NOTE — Telephone Encounter (Signed)
Sched appt 02/07/17 at 3:00. Spoke to pt.

## 2017-01-15 NOTE — Telephone Encounter (Signed)
-----   Message from Mena Goes, RN sent at 01/12/2017  1:12 PM EST ----- Regarding: 3 weeks, see note on PAs    ----- Message ----- From: Angelia Mould, MD Sent: 01/12/2017   9:25 AM To: Vvs Charge Pool Subject: charge                                          PROCEDURE:   Placement of right upper arm AV graft (4-7 mm PTFE graft)  SURGEON: Judeth Cornfield. Scot Dock, MD, FACS  ASSIST: Leontine Locket, PA  This patient needs a follow-up visit in approximately 3 weeks.  This would be a perfect patient for the PAs to see if they have their own schedule.  If not, it would be best for this patient to be scheduled after 1 PM when the PAs are they are as this would be the most efficient method of scheduling.  Thank you. CD

## 2017-01-16 ENCOUNTER — Ambulatory Visit: Payer: BLUE CROSS/BLUE SHIELD | Admitting: Internal Medicine

## 2017-01-24 ENCOUNTER — Encounter (HOSPITAL_COMMUNITY): Payer: BLUE CROSS/BLUE SHIELD

## 2017-01-24 ENCOUNTER — Telehealth: Payer: Self-pay

## 2017-01-24 ENCOUNTER — Ambulatory Visit: Payer: BLUE CROSS/BLUE SHIELD | Admitting: Internal Medicine

## 2017-01-24 ENCOUNTER — Encounter: Payer: BLUE CROSS/BLUE SHIELD | Admitting: Vascular Surgery

## 2017-01-24 NOTE — Telephone Encounter (Signed)
Pt has multiple no show/cancellations on file:  05/11/2014 09/28/2014 02/01/2015 02/18/2015 08/25/2015 06/28/2016 07/05/2016 09/13/2016 01/16/2017 01/24/2017  Would you like to begin dismissal process?

## 2017-01-24 NOTE — Telephone Encounter (Signed)
Dismissal letter forwarded to [place], Engineer, building services.

## 2017-01-24 NOTE — Telephone Encounter (Signed)
I understand very busy with the nephrologist but he had 5 no-shows only this year!  Please dismiss him

## 2017-02-01 ENCOUNTER — Telehealth: Payer: Self-pay | Admitting: Internal Medicine

## 2017-02-01 NOTE — Telephone Encounter (Signed)
Patient dismissed from [facility] by Colon Branch MD , effective January 24, 2017. Dismissal letter sent out by certified / registered mail.  daj

## 2017-02-07 ENCOUNTER — Encounter: Payer: BLUE CROSS/BLUE SHIELD | Admitting: Vascular Surgery

## 2017-02-07 ENCOUNTER — Ambulatory Visit (INDEPENDENT_AMBULATORY_CARE_PROVIDER_SITE_OTHER): Payer: BLUE CROSS/BLUE SHIELD | Admitting: Vascular Surgery

## 2017-02-07 ENCOUNTER — Other Ambulatory Visit: Payer: Self-pay

## 2017-02-07 ENCOUNTER — Encounter: Payer: Self-pay | Admitting: Vascular Surgery

## 2017-02-07 VITALS — BP 139/88 | HR 73 | Temp 99.0°F | Resp 16 | Ht 76.0 in | Wt 235.0 lb

## 2017-02-07 DIAGNOSIS — N186 End stage renal disease: Secondary | ICD-10-CM

## 2017-02-07 DIAGNOSIS — Z992 Dependence on renal dialysis: Secondary | ICD-10-CM

## 2017-02-07 NOTE — Progress Notes (Signed)
  POST OPERATIVE OFFICE NOTE    CC:  F/u for surgery  HPI:  [name] who is s/p Right UE AV graft.  He is currently on HD via right TDC.  He reports no weakness, coolness or pain in his right hand.    No Known Allergies  Current Outpatient Medications  Medication Sig Dispense Refill  . amLODipine (NORVASC) 10 MG tablet Take 1 tablet (10 mg total) by mouth daily. (Patient taking differently: Take 10 mg at bedtime by mouth. ) 90 tablet 3  . b complex-vitamin c-folic acid (NEPHRO-VITE) 0.8 MG TABS tablet Take 1 tablet at bedtime by mouth.    . bisacodyl (DULCOLAX) 5 MG EC tablet Take 5 mg by mouth daily.    . calcium acetate (PHOSLO) 667 MG capsule Take 667-1,334 mg See admin instructions by mouth. Take 1 capsule (667 mg) by mouth with snacks and 2 capsule (1334 mg) with each meal.    . carvedilol (COREG) 25 MG tablet Take 1 tablet (25 mg total) by mouth 2 (two) times daily with a meal. 180 tablet 3  . furosemide (LASIX) 80 MG tablet Take 80 mg at bedtime by mouth.     . oxyCODONE-acetaminophen (ROXICET) 5-325 MG tablet Take 1 tablet by mouth every 6 (six) hours as needed. 12 tablet 0  . sodium bicarbonate 650 MG tablet Take 650 mg by mouth 2 (two) times daily.     No current facility-administered medications for this visit.      ROS:  See HPI  Physical Exam:  Vitals:   02/07/17 1516  BP: 139/88  Pulse: 73  Resp: 16  Temp: 99 F (37.2 C)  SpO2: 99%    Incision:  Well healed, palpable thrill in the av graft Extremities:  Grip right hand 5/5, sensation intact   Assessment/Plan:  [name] who is s/p: right UE av graft   The graft is patent and he has no symptoms of steal.  The graft may be used for HD as of 02/10/2017 on his normal HD day.  Once the graft is used with success he will be scheduled for [facility] removal.     Roxy Horseman  PA-C [facility] [phone]  Clinic MD:  Scot Dock

## 2017-02-26 NOTE — Telephone Encounter (Signed)
thx

## 2017-02-26 NOTE — Telephone Encounter (Signed)
Received signed domestic return receipt verifying delivery of certified letter on February 22, 2017. Article number [id number] PTY

## 2017-05-13 IMAGING — US US RENAL
1 series · 14 of 25 positions shown · non-contrast
Comparison: 08/02/2012

CLINICAL DATA: Elevated creatinine

EXAM:
RENAL / URINARY TRACT ULTRASOUND COMPLETE

[Series 1: us renal · 0.24mm/px · 14 of 39 slices shown]
[im 1/39]
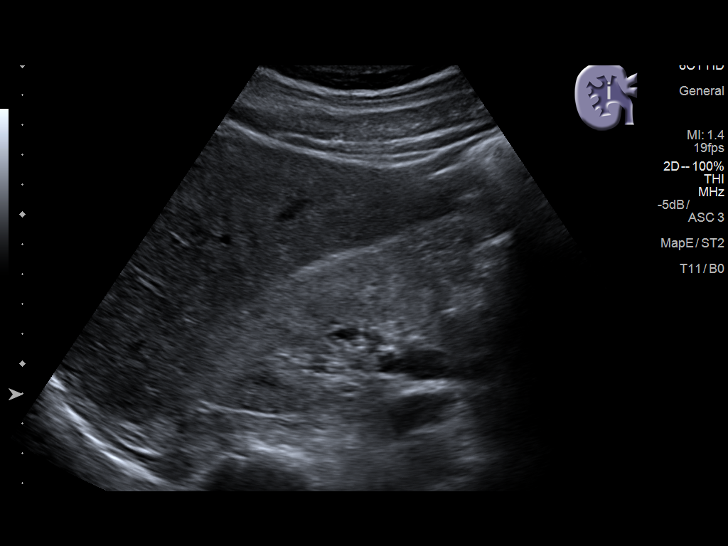
[im 4/39]
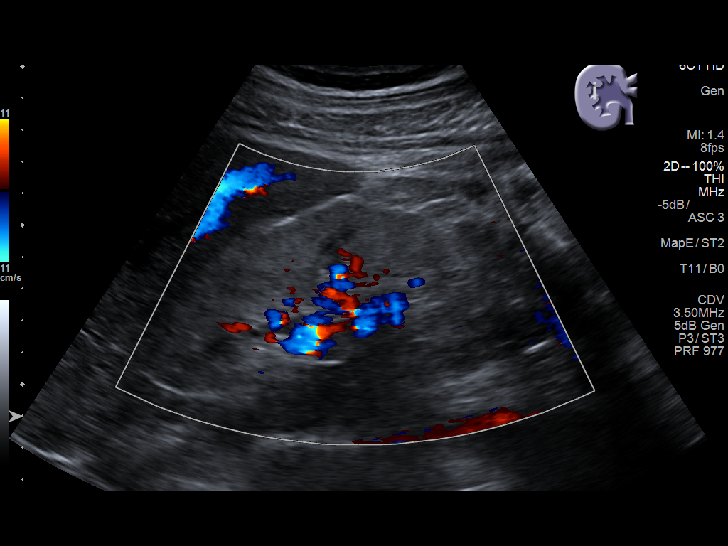
[im 7/39]
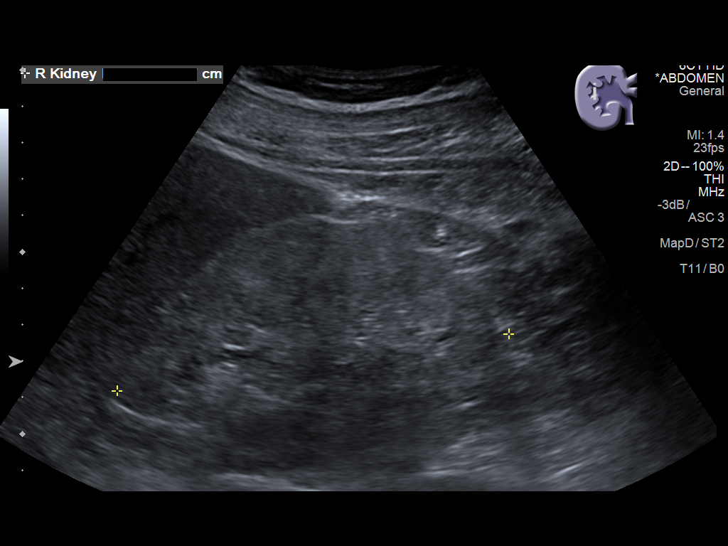
[im 10/39]
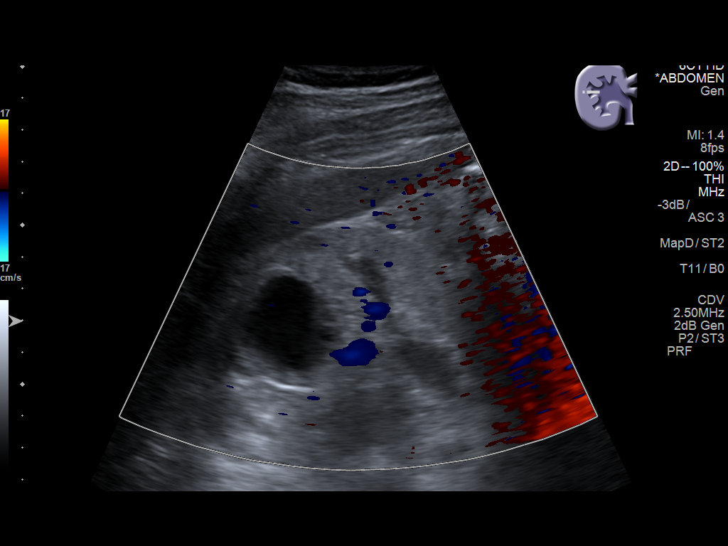
[im 13/39]
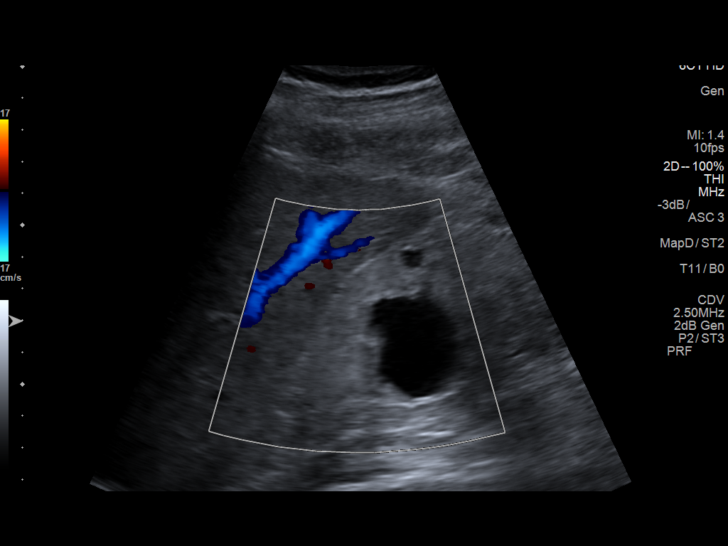
[im 15/39]
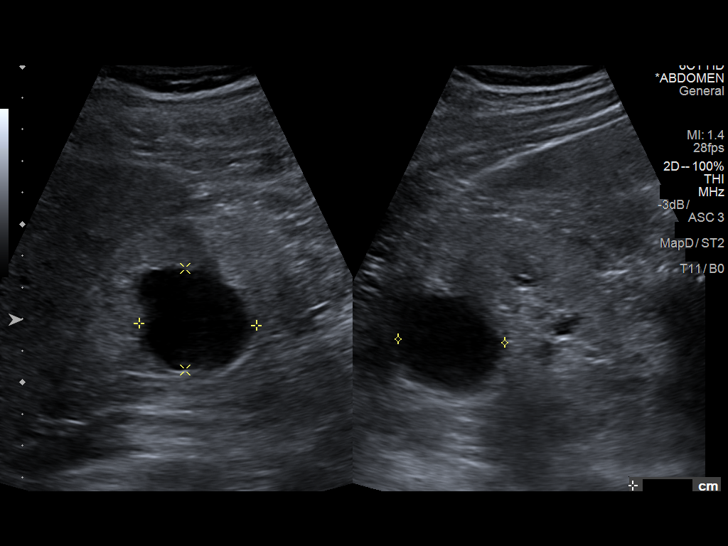
[im 18/39]
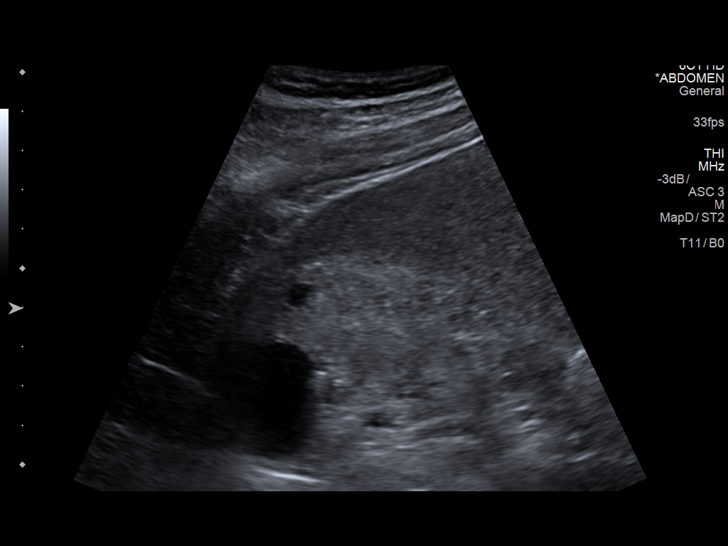
[im 21/39]
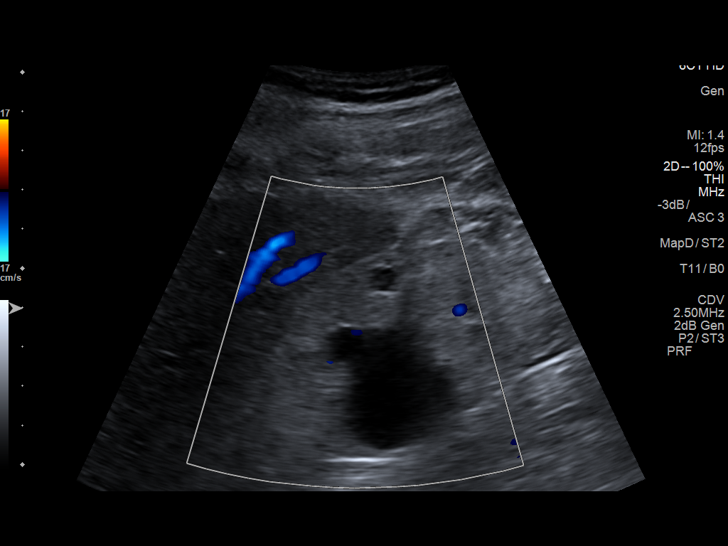
[im 24/39]
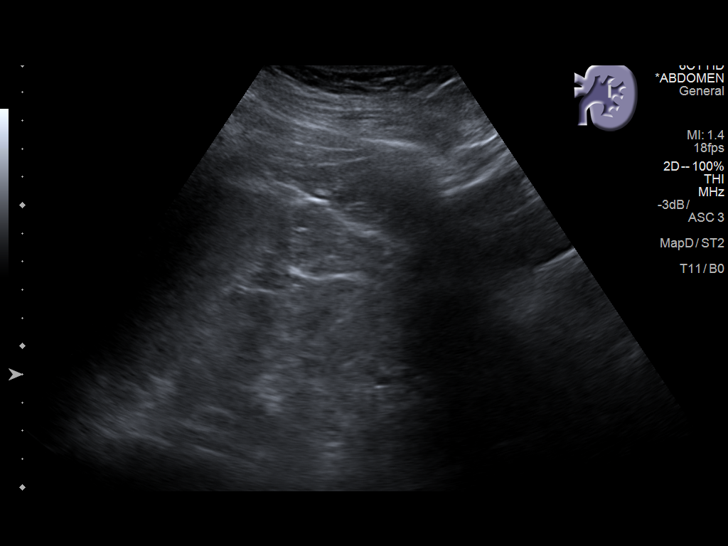
[im 26/39]
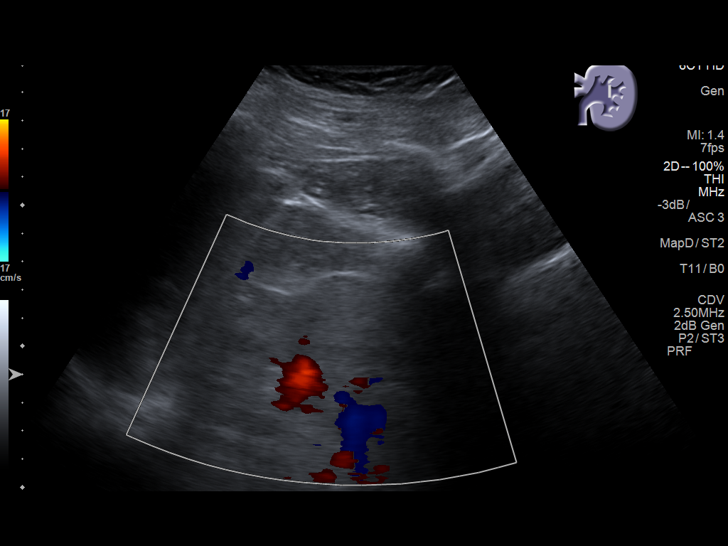
[im 29/39]
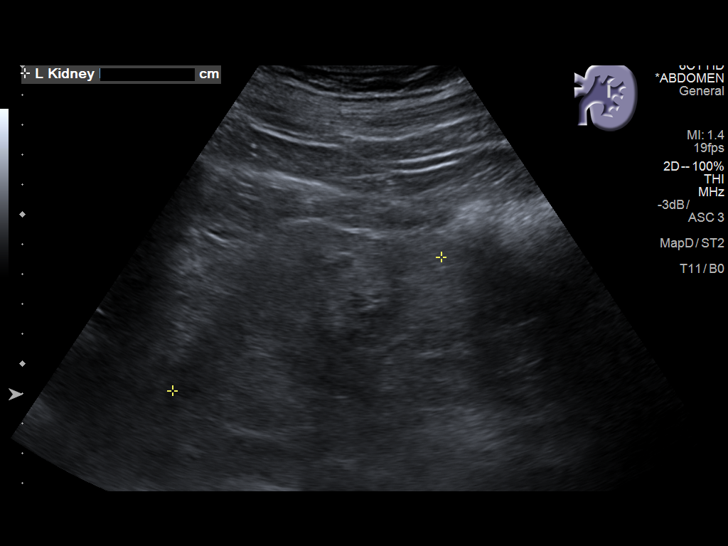
[im 32/39]
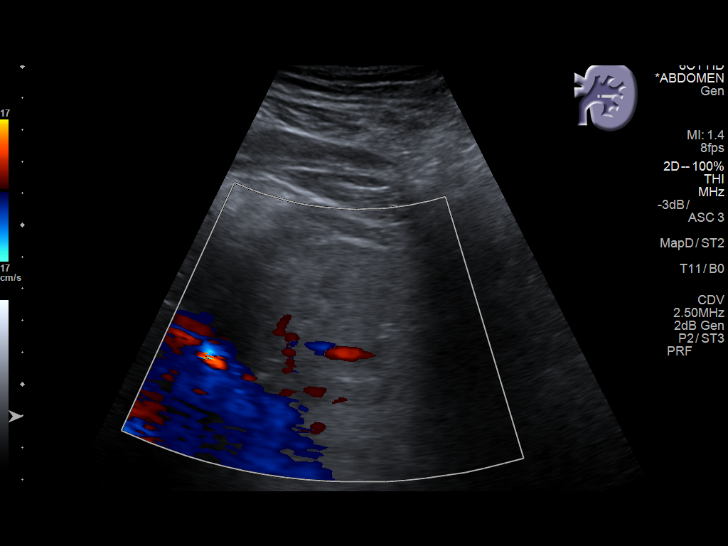
[im 35/39]
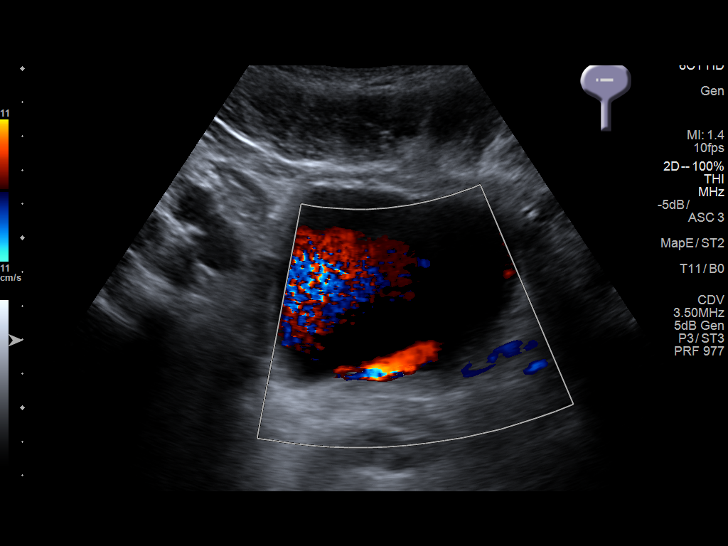
[im 39/39]
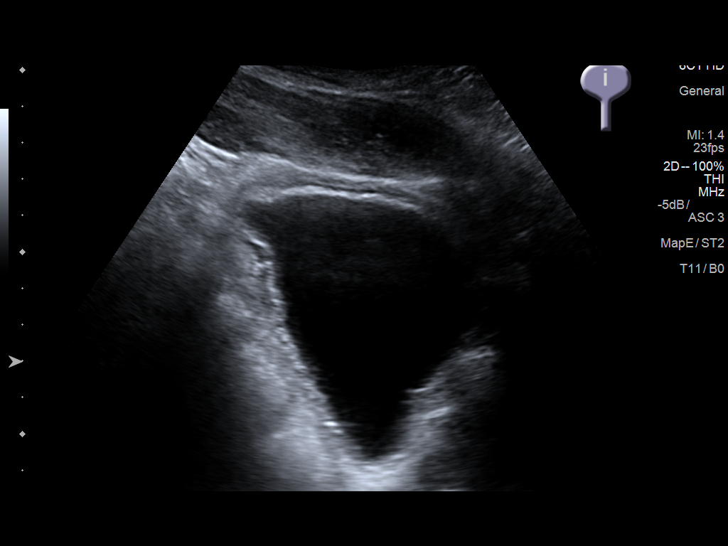

[14 of 25 positions shown; findings below may reference images not displayed]

FINDINGS: Right Kidney:

Length: 10.9 cm. 3.7 cm simple appearing cyst in the midpole with
adjacent 1 cm cyst. Diffusely increased echotexture throughout the
right kidney.. No hydronephrosis.

Left Kidney:

Length: 10.0 cm. Diffusely increased echotexture throughout the left
kidney. No hydronephrosis.

Bladder:

Appears normal for degree of bladder distention.
IMPRESSION: Increased echotexture within the kidneys bilaterally compatible
chronic medical renal disease.

Benign appearing right renal cysts.

No acute findings.

## 2018-05-23 ENCOUNTER — Ambulatory Visit (INDEPENDENT_AMBULATORY_CARE_PROVIDER_SITE_OTHER): Payer: BLUE CROSS/BLUE SHIELD | Admitting: Gastroenterology

## 2018-05-23 ENCOUNTER — Other Ambulatory Visit: Payer: Self-pay

## 2018-05-23 ENCOUNTER — Telehealth: Payer: Self-pay | Admitting: *Deleted

## 2018-05-23 DIAGNOSIS — D638 Anemia in other chronic diseases classified elsewhere: Secondary | ICD-10-CM

## 2018-05-23 DIAGNOSIS — R195 Other fecal abnormalities: Secondary | ICD-10-CM | POA: Diagnosis not present

## 2018-05-23 DIAGNOSIS — N186 End stage renal disease: Secondary | ICD-10-CM | POA: Diagnosis not present

## 2018-05-23 DIAGNOSIS — Z992 Dependence on renal dialysis: Secondary | ICD-10-CM | POA: Diagnosis not present

## 2018-05-23 NOTE — Patient Instructions (Addendum)
You will be contacted to schedule EGD and colonoscopy for evaluation of heme positive stool.  We will plan to schedule the procedures in 6 to 8 weeks due to COVID-19.   Upper Endoscopy, Adult Upper endoscopy is a procedure to look inside the upper GI (gastrointestinal) tract. The upper GI tract is made up of:  The part of the body that moves food from your mouth to your stomach (esophagus).  The stomach.  The first part of your small intestine (duodenum). This procedure is also called esophagogastroduodenoscopy (EGD) or gastroscopy. In this procedure, your health care provider passes a thin, flexible tube (endoscope) through your mouth and down your esophagus into your stomach. A small camera is attached to the end of the tube. Images from the camera appear on a monitor in the exam room. During this procedure, your health care provider may also remove a small piece of tissue to be sent to a lab and examined under a microscope (biopsy). Your health care provider may do an upper endoscopy to diagnose cancers of the upper GI tract. You may also have this procedure to find the cause of other conditions, such as:  Stomach pain.  Heartburn.  Pain or problems when swallowing.  Nausea and vomiting.  Stomach bleeding.  Stomach ulcers. Tell a health care provider about:  Any allergies you have.  All medicines you are taking, including vitamins, herbs, eye drops, creams, and over-the-counter medicines.  Any problems you or family members have had with anesthetic medicines.  Any blood disorders you have.  Any surgeries you have had.  Any medical conditions you have.  Whether you are pregnant or may be pregnant. What are the risks? Generally, this is a safe procedure. However, problems may occur, including:  Infection.  Bleeding.  Allergic reactions to medicines.  A tear or hole (perforation) in the esophagus, stomach, or duodenum. What happens before the procedure? Staying  hydrated Follow instructions from your health care provider about hydration, which may include:  Up to 2 hours before the procedure - you may continue to drink clear liquids, such as water, clear fruit juice, black coffee, and plain tea.  Eating and drinking restrictions Follow instructions from your health care provider about eating and drinking, which may include:  8 hours before the procedure - stop eating heavy meals or foods, such as meat, fried foods, or fatty foods.  6 hours before the procedure - stop eating light meals or foods, such as toast or cereal.  6 hours before the procedure - stop drinking milk or drinks that contain milk.  2 hours before the procedure - stop drinking clear liquids. Medicines Ask your health care provider about:  Changing or stopping your regular medicines. This is especially important if you are taking diabetes medicines or blood thinners.  Taking medicines such as aspirin and ibuprofen. These medicines can thin your blood. Do not take these medicines unless your health care provider tells you to take them.  Taking over-the-counter medicines, vitamins, herbs, and supplements. General instructions  Plan to have someone take you home from the hospital or clinic.  If you will be going home right after the procedure, plan to have someone with you for 24 hours.  Ask your health care provider what steps will be taken to help prevent infection. What happens during the procedure?   An IV will be inserted into one of your veins.  You may be given one or more of the following: ? A medicine to help you  relax (sedative). ? A medicine to numb the throat (local anesthetic).  You will lie on your left side on an exam table.  Your health care provider will pass the endoscope through your mouth and down your esophagus.  Your health care provider will use the scope to check the inside of your esophagus, stomach, and duodenum. Biopsies may be taken.  The  endoscope will be removed. The procedure may vary among health care providers and hospitals. What happens after the procedure?  Your blood pressure, heart rate, breathing rate, and blood oxygen level will be monitored until you leave the hospital or clinic.  Do not drive for 24 hours if you were given a sedative during your procedure.  When your throat is no longer numb, you may be given some fluids to drink.  It is up to you to get the results of your procedure. Ask your health care provider, or the department that is doing the procedure, when your results will be ready. Summary  Upper endoscopy is a procedure to look inside the upper GI tract.  During the procedure, an IV will be inserted into one of your veins. You may be given a medicine to help you relax.  A medicine will be used to numb your throat.  The endoscope will be passed through your mouth and down your esophagus. This information is not intended to replace advice given to you by your health care provider. Make sure you discuss any questions you have with your health care provider. Document Released: 02/04/2000 Document Revised: 07/09/2017 Document Reviewed: 07/09/2017 Elsevier Interactive Patient Education  2019 [employer].   Colonoscopy, Adult A colonoscopy is an exam to look at the entire large intestine. During the exam, a lubricated, flexible tube that has a camera on the end of it is inserted into the anus and then passed into the rectum, colon, and other parts of the large intestine. You may have a colonoscopy as a part of normal colorectal screening or if you have certain symptoms, such as:  Lack of red blood cells (anemia).  Diarrhea that does not go away.  Abdominal pain.  Blood in your stool (feces). A colonoscopy can help screen for and diagnose medical problems, including:  Tumors.  Polyps.  Inflammation.  Areas of bleeding. Tell a health care provider about:  Any allergies you have.  All  medicines you are taking, including vitamins, herbs, eye drops, creams, and over-the-counter medicines.  Any problems you or family members have had with anesthetic medicines.  Any blood disorders you have.  Any surgeries you have had.  Any medical conditions you have.  Any problems you have had passing stool. What are the risks? Generally, this is a safe procedure. However, problems may occur, including:  Bleeding.  A tear in the intestine.  A reaction to medicines given during the exam.  Infection (rare). What happens before the procedure? Eating and drinking restrictions Follow instructions from your health care provider about eating and drinking, which may include:  A few days before the procedure - follow a low-fiber diet. Avoid nuts, seeds, dried fruit, raw fruits, and vegetables.  1-3 days before the procedure - follow a clear liquid diet. Drink only clear liquids, such as clear broth or bouillon, black coffee or tea, clear juice, clear soft drinks or sports drinks, gelatin dessert, and popsicles. Avoid any liquids that contain red or purple dye.  On the day of the procedure - do not eat or drink anything starting 2 hours  before the procedure, or within the time period that your health care provider recommends. Up to 2 hours before the procedure, you may continue to drink clear liquids, such as water or clear fruit juice. Bowel prep If you were prescribed an oral bowel prep to clean out your colon:  Take it as told by your health care provider. Starting the day before your procedure, you will need to drink a large amount of medicated liquid. The liquid will cause you to have multiple loose stools until your stool is almost clear or light green.  If your skin or anus gets irritated from diarrhea, you may use these to relieve the irritation: ? Medicated wipes, such as adult wet wipes with aloe and vitamin E. ? A skin-soothing product like petroleum jelly.  If you vomit  while drinking the bowel prep, take a break for up to 60 minutes and then begin the bowel prep again. If vomiting continues and you cannot take the bowel prep without vomiting, call your health care provider.  To clean out your colon, you may also be given: ? Laxative medicines. ? Instructions about how to use an enema. General instructions  Ask your health care provider about: ? Changing or stopping your regular medicines or supplements. This is especially important if you are taking iron supplements, diabetes medicines, or blood thinners. ? Taking medicines such as aspirin and ibuprofen. These medicines can thin your blood. Do not take these medicines before the procedure if your health care provider tells you not to.  Plan to have someone take you home from the hospital or clinic. What happens during the procedure?   An IV may be inserted into one of your veins.  You will be given medicine to help you relax (sedative).  To reduce your risk of infection: ? Your health care team will wash or sanitize their hands. ? Your anal area will be washed with soap.  You will be asked to lie on your side with your knees bent.  Your health care provider will lubricate a long, thin, flexible tube. The tube will have a camera and a light on the end.  The tube will be inserted into your anus.  The tube will be gently eased through your rectum and colon.  Air will be delivered into your colon to keep it open. You may feel some pressure or cramping.  The camera will be used to take images during the procedure.  A small tissue sample may be removed to be examined under a microscope (biopsy).  If small polyps are found, your health care provider may remove them and have them checked for cancer cells.  When the exam is done, the tube will be removed. The procedure may vary among health care providers and hospitals. What happens after the procedure?  Your blood pressure, heart rate, breathing  rate, and blood oxygen level will be monitored until the medicines you were given have worn off.  Do not drive for 24 hours after the exam.  You may have a small amount of blood in your stool.  You may pass gas and have mild abdominal cramping or bloating due to the air that was used to inflate your colon during the exam.  It is up to you to get the results of your procedure. Ask your health care provider, or the department performing the procedure, when your results will be ready. Summary  A colonoscopy is an exam to look at the entire large intestine.  During  a colonoscopy, a lubricated, flexible tube with a camera on the end of it is inserted into the anus and then passed into the colon and other parts of the large intestine.  Follow instructions from your health care provider about eating and drinking before the procedure.  If you were prescribed an oral bowel prep to clean out your colon, take it as told by your health care provider.  After your procedure, your blood pressure, heart rate, breathing rate, and blood oxygen level will be monitored until the medicines you were given have worn off. This information is not intended to replace advice given to you by your health care provider. Make sure you discuss any questions you have with your health care provider. Document Released: 02/04/2000 Document Revised: 11/29/2016 Document Reviewed: 04/20/2015 Elsevier Interactive Patient Education  2019 [employer].

## 2018-05-23 NOTE — Progress Notes (Signed)
Chad Adkins    [id number]    [birth year]  Primary Care Physician:No primary care provider on file.  Referring Physician: No referring provider defined for this encounter.  This service was provided via telemedicine due to [place] 19.  Patient location: Home Provider location: Office Used 2 patient identifiers to confirm the correct person. Explained the limitations in evaluation and management via telemedicine. Patient is aware of potential medical charges for this visit.  Patient consented to this virtual visit (via telephone/webex).  The persons participating in this telemedicine service were myself and the patient  Interactive audio and video telecommunications were attempted between this provider and patient, however failed, due to patient having technical difficulties OR patient did not have access to video capability. We continued and completed visit with audio only.    Chief complaint:  Anemia, heme occult positive  HPI: 16 yr M with HTN, TIA 4 years ago, ESRD on HD initiated last year referred for evaluation of fecal Hemoccult positive and chronic anemia. He has had evaluation for possible renal transplant but currently not on renal transplant list due to insurance/financial reasons.  He has intermittent dark stool.  Takes Nephro-Vite with oral iron tablets daily Denies any nausea, vomiting, abdominal pain, melena or bright red blood per rectum  He has chronic anemia with hemoglobin ranging 8-9 even going back based on labs in 2018.  No evidence of iron deficiency.   Never had EGD or colonoscopy  No family history of GI malignancy  April 24, 2018 hemoglobin 8.8, hematocrit 28.6, BUN 62, creatinine 18, reticulocyte count 341, PTH elevated at 257.  LFT and iron panel within normal limits.  Hemoglobin 9.4 and hematocrit 28 May 06, 2018  Hemoglobin dropped to 8.3 and hematocrit 24.9 on May 15, 2018.  Iron 75, TIBC 256, iron saturation 29% fecal  Hemoccult cards 2 out of 3 positive  Outpatient Encounter Medications as of 05/23/2018  Medication Sig  . amLODipine (NORVASC) 10 MG tablet Take 1 tablet (10 mg total) by mouth daily. (Patient taking differently: Take 10 mg at bedtime by mouth. )  . b complex-vitamin c-folic acid (NEPHRO-VITE) 0.8 MG TABS tablet Take 1 tablet at bedtime by mouth.  . bisacodyl (DULCOLAX) 5 MG EC tablet Take 5 mg by mouth daily.  . calcium acetate (PHOSLO) 667 MG capsule Take 667-1,334 mg See admin instructions by mouth. Take 1 capsule (667 mg) by mouth with snacks and 2 capsule (1334 mg) with each meal.  . carvedilol (COREG) 25 MG tablet Take 1 tablet (25 mg total) by mouth 2 (two) times daily with a meal.  . furosemide (LASIX) 80 MG tablet Take 80 mg at bedtime by mouth.   . oxyCODONE-acetaminophen (ROXICET) 5-325 MG tablet Take 1 tablet by mouth every 6 (six) hours as needed.  . sodium bicarbonate 650 MG tablet Take 650 mg by mouth 2 (two) times daily.   No facility-administered encounter medications on file as of 05/23/2018.     Allergies as of 05/23/2018  . (No Known Allergies)    Past Medical History:  Diagnosis Date  . Abnormal ECG 07/16/2012   echo--- [facility]  . Chronic kidney disease, stage [facility]) 2018   Hemodialysis - [place]  . CRF (chronic renal failure) 2018  . H/O hyperkalemia   . Hypertension   . Kidney failure, acute ([place]) 2018    Past Surgical History:  Procedure Laterality Date  . AV FISTULA PLACEMENT  Right 10/17/2016   Procedure: ARTERIOVENOUS (AV) FISTULA CREATION RIGHT ARM;  Surgeon: Angelia Mould, MD;  Location[facility];  Service: Vascular;  Laterality: Right;  . AV FISTULA PLACEMENT Right 01/12/2017   Procedure: INSERTION OF ARTERIOVENOUS (AV) GORE-TEX STRETCH 4-7MM GRAFT INTO RIGHT ARM;  Surgeon: Angelia Mould, MD;  Location: [place];  Service: Vascular;  Laterality: Right;  . BASCILIC VEIN TRANSPOSITION Right 12/07/2016   Procedure: right arm 2ND  STAGE BASCILIC VEIN TRANSPOSITION with revision and interposition gortex PTFE graft;  Surgeon: Angelia Mould, MD;  Location: [place];  Service: Vascular;  Laterality: Right;  . HERNIA REPAIR Left    inguinal- was a baby  . INSERTION OF DIALYSIS CATHETER Right 12/07/2016   Procedure: INSERTION OF right internal jugular DIALYSIS CATHETER with ultrasound guided access;  Surgeon: Angelia Mould, MD;  Location: [place];  Service: Vascular;  Laterality: Right;  . ROTATOR CUFF REPAIR Right     Family History  Problem Relation Age of Onset  . Hyperlipidemia Father   . Hypertension Mother   . Breast cancer Mother   . CAD Neg Hx   . Diabetes Neg Hx   . Stroke Neg Hx   . Colon cancer Neg Hx   . Prostate cancer Neg Hx     Social History   Socioeconomic History  . Marital status: Single    Spouse name: Not on file  . Number of children: 2  . Years of education: Not on file  . Highest education level: Not on file  Occupational History  . Occupation: Freight forwarder , [employer]   Social Needs  . Financial resource strain: Not on file  . Food insecurity:    Worry: Not on file    Inability: Not on file  . Transportation needs:    Medical: Not on file    Non-medical: Not on file  Tobacco Use  . Smoking status: Current Every Day Smoker    Packs/day: 4.00    Years: 20.00    Pack years: 80.00    Types: Cigars  . Smokeless tobacco: Never Used  Substance and Sexual Activity  . Alcohol use: Yes    Alcohol/week: 0.0 standard drinks    Comment: rare  . Drug use: No  . Sexual activity: Not on file  Lifestyle  . Physical activity:    Days per week: Not on file    Minutes per session: Not on file  . Stress: Not on file  Relationships  . Social connections:    Talks on phone: Not on file    Gets together: Not on file    Attends religious service: Not on file    Active member of club or organization: Not on file    Attends meetings of clubs or organizations: Not on file     Relationship status: Not on file  . Intimate partner violence:    Fear of current or ex partner: Not on file    Emotionally abused: Not on file    Physically abused: Not on file    Forced sexual activity: Not on file  Other Topics Concern  . Not on file  Social History Narrative   Lives by himself       Review of systems: Review of Systems as per HPI All other systems reviewed and are negative.   Physical Exam: Vitals were not taken and physical exam was not performed during this virtual visit.  Data Reviewed:  Reviewed labs, radiology imaging, old records  and pertinent past GI work up   Assessment and Plan/Recommendations:  53 year old male with history of hypertension, status post TIA, end-stage renal disease on hemodialysis and chronic anemia Fecal Hemoccult positive  Chronic anemia at baseline, likely anemia secondary to chronic disease, no evidence of iron deficiency  Will need EGD and colonoscopy for further evaluation to identify source of possible occult GI blood loss and screening for colorectal cancer The risks and benefits as well as alternatives of endoscopic procedure(s) have been discussed and reviewed. All questions answered. The patient agrees to proceed.   Due to current situation with COVID-19, non-urgent procedures are currently on hold.  Will schedule in 6 to 8 weeks once the restrictions are lifted in terms of scheduling the procedures.  Discussed with patient, he is in agreement.  Continue to monitor hemoglobin   Advised patient to call back with any change or worsening symptoms  K. Denzil Magnuson , MD   CC: No ref. provider found

## 2018-05-23 NOTE — Telephone Encounter (Signed)
error 

## 2018-06-20 ENCOUNTER — Other Ambulatory Visit: Payer: Self-pay

## 2018-06-20 ENCOUNTER — Encounter: Payer: Self-pay | Admitting: Gastroenterology

## 2018-06-20 ENCOUNTER — Ambulatory Visit (AMBULATORY_SURGERY_CENTER): Payer: Self-pay

## 2018-06-20 VITALS — Ht 76.0 in | Wt 235.0 lb

## 2018-06-20 DIAGNOSIS — R195 Other fecal abnormalities: Secondary | ICD-10-CM

## 2018-06-20 DIAGNOSIS — D638 Anemia in other chronic diseases classified elsewhere: Secondary | ICD-10-CM

## 2018-06-20 NOTE — Progress Notes (Signed)
Denies allergies to eggs or soy products. Denies complication of anesthesia or sedation. Denies use of weight loss medication. Denies use of O2.   Emmi instructions given for colonoscopy.  Pre-Visit was conducted by phone due to Covid 19. Instructions were reviewed with patient and were mailed to confirmed home address. A 15.00 dollar coupon for Suprep was given to the patient.  Patient was encouraged to call if he has any questions or concerns.

## 2018-06-28 ENCOUNTER — Telehealth: Payer: Self-pay | Admitting: *Deleted

## 2018-06-28 MED ORDER — NA SULFATE-K SULFATE-MG SULF 17.5-3.13-1.6 GM/177ML PO SOLN: 1.0000 | Freq: Once | ORAL | 0 refills | Status: AC

## 2018-06-28 NOTE — Telephone Encounter (Signed)
LMOM to confirm appt and to go over COVID screening questions.  Will call back later

## 2018-06-28 NOTE — Telephone Encounter (Signed)
Left detailed message- care partner will be waiting in car during procedure, please wear a mask into building and to call our office beforehand if yes to any of the screening questions- traveled in past 2 weeks, fever or respiratory problems or any family members/close contacts with covid 19 in the past two weeks.

## 2018-06-28 NOTE — Telephone Encounter (Signed)
Pt returning phone call to answer screening questions.

## 2018-06-28 NOTE — Telephone Encounter (Signed)
Covid-19 travel screening questions  Have you traveled in the last 14 days? no If yes where?  Do you now or have you had a fever in the last 14 days? no  Do you have any respiratory symptoms of shortness of breath or cough now or in the last 14 days? no  Do you have any family members or close contacts with diagnosed or suspected Covid-19? no       

## 2018-07-01 ENCOUNTER — Encounter: Payer: Self-pay | Admitting: Gastroenterology

## 2018-07-01 ENCOUNTER — Other Ambulatory Visit: Payer: Self-pay

## 2018-07-01 ENCOUNTER — Ambulatory Visit (AMBULATORY_SURGERY_CENTER): Payer: BLUE CROSS/BLUE SHIELD | Admitting: Gastroenterology

## 2018-07-01 VITALS — BP 118/74 | HR 71 | Temp 98.2°F | Resp 31 | Ht 76.0 in | Wt 235.0 lb

## 2018-07-01 DIAGNOSIS — K3189 Other diseases of stomach and duodenum: Secondary | ICD-10-CM

## 2018-07-01 DIAGNOSIS — B9681 Helicobacter pylori [H. pylori] as the cause of diseases classified elsewhere: Secondary | ICD-10-CM | POA: Diagnosis not present

## 2018-07-01 DIAGNOSIS — D638 Anemia in other chronic diseases classified elsewhere: Secondary | ICD-10-CM

## 2018-07-01 DIAGNOSIS — K2970 Gastritis, unspecified, without bleeding: Secondary | ICD-10-CM | POA: Diagnosis not present

## 2018-07-01 DIAGNOSIS — D125 Benign neoplasm of sigmoid colon: Secondary | ICD-10-CM

## 2018-07-01 DIAGNOSIS — D500 Iron deficiency anemia secondary to blood loss (chronic): Secondary | ICD-10-CM

## 2018-07-01 DIAGNOSIS — K210 Gastro-esophageal reflux disease with esophagitis: Secondary | ICD-10-CM | POA: Diagnosis not present

## 2018-07-01 DIAGNOSIS — R195 Other fecal abnormalities: Secondary | ICD-10-CM

## 2018-07-01 DIAGNOSIS — K648 Other hemorrhoids: Secondary | ICD-10-CM

## 2018-07-01 DIAGNOSIS — K449 Diaphragmatic hernia without obstruction or gangrene: Secondary | ICD-10-CM

## 2018-07-01 DIAGNOSIS — K635 Polyp of colon: Secondary | ICD-10-CM

## 2018-07-01 MED ORDER — OMEPRAZOLE 40 MG PO CPDR: 40.0000 mg | DELAYED_RELEASE_CAPSULE | Freq: Two times a day (BID) | ORAL | 0 refills | Status: DC

## 2018-07-01 MED ORDER — SODIUM CHLORIDE 0.9 % IV SOLN: 500.0000 mL | Freq: Once | INTRAVENOUS | Status: DC

## 2018-07-01 NOTE — Patient Instructions (Signed)
Information on polyps given to you today.  Await pathology results.  Start Omeprazole 40mg  by mouth twice a day for 3 months.  No aspirin, ibuprofen, naproxen or other non steroidal anti inflammatory medications.  YOU HAD AN ENDOSCOPIC PROCEDURE TODAY AT [facility]:   Refer to the procedure report that was given to you for any specific questions about what was found during the examination.  If the procedure report does not answer your questions, please call your gastroenterologist to clarify.  If you requested that your care partner not be given the details of your procedure findings, then the procedure report has been included in a sealed envelope for you to review at your convenience later.  YOU SHOULD EXPECT: Some feelings of bloating in the abdomen. Passage of more gas than usual.  Walking can help get rid of the air that was put into your GI tract during the procedure and reduce the bloating. If you had a lower endoscopy (such as a colonoscopy or flexible sigmoidoscopy) you may notice spotting of blood in your stool or on the toilet paper. If you underwent a bowel prep for your procedure, you may not have a normal bowel movement for a few days.  Please Note:  You might notice some irritation and congestion in your nose or some drainage.  This is from the oxygen used during your procedure.  There is no need for concern and it should clear up in a day or so.  SYMPTOMS TO REPORT IMMEDIATELY:   Following lower endoscopy (colonoscopy or flexible sigmoidoscopy):  Excessive amounts of blood in the stool  Significant tenderness or worsening of abdominal pains  Swelling of the abdomen that is new, acute  Fever of 100F or higher   Following upper endoscopy (EGD)  Vomiting of blood or coffee ground material  New chest pain or pain under the shoulder blades  Painful or persistently difficult swallowing  New shortness of breath  Fever of 100F or higher  Black, tarry-looking  stools  For urgent or emergent issues, a gastroenterologist can be reached at any hour by calling [phone].   DIET:  We do recommend a small meal at first, but then you may proceed to your regular diet.  Drink plenty of fluids but you should avoid alcoholic beverages for 24 hours.  ACTIVITY:  You should plan to take it easy for the rest of today and you should NOT DRIVE or use heavy machinery until tomorrow (because of the sedation medicines used during the test).    FOLLOW UP: Our staff will call the number listed on your records the next business day following your procedure to check on you and address any questions or concerns that you may have regarding the information given to you following your procedure. If we do not reach you, we will leave a message.  However, if you are feeling well and you are not experiencing any problems, there is no need to return our call.  We will assume that you have returned to your regular daily activities without incident. We will be calling you two weeks following your procedure to see if you have developed any symptoms of the COVID-19.  If you develop any symptoms before then, please let us know.  If any biopsies were taken you will be contacted by phone or by letter within the next 1-3 weeks.  Please call us at [phone] if you have not heard about the biopsies in 3 weeks.  SIGNATURES/CONFIDENTIALITY: You and/or your care partner have signed paperwork which will be entered into your electronic medical record.  These signatures attest to the fact that that the information above on your After Visit Summary has been reviewed and is understood.  Full responsibility of the confidentiality of this discharge information lies with you and/or your care-partner.

## 2018-07-01 NOTE — Op Note (Signed)
[facility] Patient Name: Kendra Woolford Procedure Date: 07/01/2018 12:58 PM MRN: [id number] Endoscopist: Mauri Pole , MD Age: 53 Referring MD:  Date of Birth: 1965/04/23 Gender: Male Account #: [id number] Procedure:                Upper GI endoscopy Indications:              Gastrointestinal bleeding of unknown origin. Heme                            positive stool. Iron deficiency anemia Medicines:                Monitored Anesthesia Care Procedure:                Pre-Anesthesia Assessment:                           - Prior to the procedure, a History and Physical                            was performed, and patient medications and                            allergies were reviewed. The patient's tolerance of                            previous anesthesia was also reviewed. The risks                            and benefits of the procedure and the sedation                            options and risks were discussed with the patient.                            All questions were answered, and informed consent                            was obtained. Prior Anticoagulants: The patient has                            taken no previous anticoagulant or antiplatelet                            agents. ASA Grade Assessment: III - A patient with                            severe systemic disease. After reviewing the risks                            and benefits, the patient was deemed in                            satisfactory condition to undergo the procedure.  After obtaining informed consent, the endoscope was                            passed under direct vision. Throughout the                            procedure, the patient's blood pressure, pulse, and                            oxygen saturations were monitored continuously. The                            Model GIF-HQ190 [id number] [id number]) scope was introduced                            through  the mouth, and advanced to the second part                            of duodenum. The upper GI endoscopy was                            accomplished without difficulty. The patient                            tolerated the procedure well. Scope In: Scope Out: Findings:                 LA Grade A (one or more mucosal breaks less than 5                            mm, not extending between tops of 2 mucosal folds)                            esophagitis with no bleeding was found 38 to 40 cm                            from the incisors.                           The Z-line was regular and was found 40 cm from the                            incisors.                           A small hiatal hernia was present.                           A large, polypoid, ulcerated with adherent heme, no                            visible vessel, partially circumferential                            (involving two-thirds  of the lumen circumference)                            mass with no bleeding and stigmata of recent                            bleeding was found in the prepyloric region of the                            stomach and at the pylorus. Biopsies were taken                            with a cold forceps for histology.                           Patchy severe inflammation with hemorrhage                            characterized by congestion (edema), erosions,                            erythema and friability was found in the entire                            examined stomach. Biopsies were taken with a cold                            forceps for Helicobacter pylori testing.                           A few localized erosions without bleeding were                            found in the duodenal bulb.                           The second portion of the duodenum was normal. Complications:            No immediate complications. Estimated Blood Loss:     Estimated blood loss was minimal. Impression:                - LA Grade A reflux esophagitis.                           - Z-line regular, 40 cm from the incisors.                           - Small hiatal hernia.                           - Rule out malignancy, gastric tumor at the pylorus                            and in the prepyloric region of the stomach.  Biopsied.                           - Gastritis with hemorrhage. Biopsied.                           - Duodenal erosions without bleeding.                           - Normal second portion of the duodenum. Recommendation:           - Patient has a contact number available for                            emergencies. The signs and symptoms of potential                            delayed complications were discussed with the                            patient. Return to normal activities tomorrow.                            Written discharge instructions were provided to the                            patient.                           - Resume previous diet.                           - Continue present medications.                           - Use Prilosec (omeprazole) 40 mg PO BID for 3                            months.                           - No aspirin, ibuprofen, naproxen, or other                            non-steroidal anti-inflammatory drugs.                           - Await pathology results.                           - Repeat upper endoscopy based on path results for                            surveillance.                           - Follow up Telemedicine visit in 4 weeks Mauri Pole, MD 07/01/2018 1:43:41 PM This report  has been signed electronically.

## 2018-07-01 NOTE — Progress Notes (Signed)
Called to room to assist during endoscopic procedure.  Patient ID and intended procedure confirmed with present staff. Received instructions for my participation in the procedure from the performing physician.  

## 2018-07-01 NOTE — Op Note (Signed)
[facility] Patient Name: Chad Adkins Procedure Date: 07/01/2018 12:57 PM MRN: [id number] Endoscopist: Mauri Pole , MD Age: 53 Referring MD:  Date of Birth: 23-Dec-1965 Gender: Male Account #: [id number] Procedure:                Colonoscopy Indications:              Evaluation of unexplained GI bleeding presenting                            with fecal occult blood Medicines:                Monitored Anesthesia Care Procedure:                Pre-Anesthesia Assessment:                           - Prior to the procedure, a History and Physical                            was performed, and patient medications and                            allergies were reviewed. The patient's tolerance of                            previous anesthesia was also reviewed. The risks                            and benefits of the procedure and the sedation                            options and risks were discussed with the patient.                            All questions were answered, and informed consent                            was obtained. Prior Anticoagulants: The patient has                            taken no previous anticoagulant or antiplatelet                            agents. ASA Grade Assessment: III - A patient with                            severe systemic disease. After reviewing the risks                            and benefits, the patient was deemed in                            satisfactory condition to undergo the procedure.  After obtaining informed consent, the colonoscope                            was passed under direct vision. Throughout the                            procedure, the patient's blood pressure, pulse, and                            oxygen saturations were monitored continuously. The                            Model PCF-[id number]) scope was introduced                            through the anus and  advanced to the the terminal                            ileum, with identification of the appendiceal                            orifice and IC valve. The colonoscopy was performed                            without difficulty. The patient tolerated the                            procedure well. The quality of the bowel                            preparation was adequate. The terminal ileum,                            ileocecal valve, appendiceal orifice, and rectum                            were photographed. Scope In: 1:22:03 PM Scope Out: 1:32:35 PM Scope Withdrawal Time: 0 hours 6 minutes 34 seconds  Total Procedure Duration: 0 hours 10 minutes 32 seconds  Findings:                 The perianal and digital rectal examinations were                            normal.                           A 11 mm polyp was found in the sigmoid colon. The                            polyp was pedunculated. The polyp was removed with                            a hot snare. Resection and retrieval were complete.  Non-bleeding internal hemorrhoids were found during                            retroflexion. The hemorrhoids were small. Complications:            No immediate complications. Impression:               - One 11 mm polyp in the sigmoid colon, removed                            with a hot snare. Resected and retrieved.                           - Non-bleeding internal hemorrhoids. Recommendation:           - Patient has a contact number available for                            emergencies. The signs and symptoms of potential                            delayed complications were discussed with the                            patient. Return to normal activities tomorrow.                            Written discharge instructions were provided to the                            patient.                           - Resume previous diet.                           - Continue  present medications.                           - Await pathology results.                           - Repeat colonoscopy in 3 years for surveillance                            based on pathology results.                           - See the other procedure note for documentation of                            additional recommendations. Mauri Pole, MD 07/01/2018 1:46:38 PM This report has been signed electronically.

## 2018-07-02 ENCOUNTER — Telehealth: Payer: Self-pay | Admitting: *Deleted

## 2018-07-02 NOTE — Telephone Encounter (Signed)
  Follow up Call-  Call back number 07/01/2018  Post procedure Call Back phone  # [phone]  Permission to leave phone message Yes  Some recent data might be hidden     Patient questions:  Do you have a fever, pain , or abdominal swelling? No. Pain Score  0 *  Have you tolerated food without any problems? Yes.    Have you been able to return to your normal activities? Yes.    Do you have any questions about your discharge instructions: Diet   No. Medications  No. Follow up visit  No.  Do you have questions or concerns about your Care? No.  Actions: * If pain score is 4 or above: No action needed, pain <4.

## 2018-07-03 ENCOUNTER — Telehealth: Payer: Self-pay | Admitting: *Deleted

## 2018-07-03 NOTE — Telephone Encounter (Signed)
1. Have you developed a fever since your procedure? No.  2.   Have you had an respiratory symptoms (SOB or cough) since your procedure? No.  3.   Have you tested positive for COVID 19 since your procedure No.  3.   Have you had any family members/close contacts diagnosed with the COVID 19 since your procedure?  No   If any of these questions are a yes, please inquire if patient has been seen by family doctor and route this note to Fuad Forget Walton, RN. 

## 2018-07-09 ENCOUNTER — Other Ambulatory Visit: Payer: Self-pay

## 2018-07-17 ENCOUNTER — Encounter: Payer: Self-pay | Admitting: Gastroenterology

## 2018-07-18 ENCOUNTER — Other Ambulatory Visit: Payer: Self-pay

## 2018-07-18 MED ORDER — BIS SUBCIT-METRONID-TETRACYC 140-125-125 MG PO CAPS: 3.0000 | ORAL_CAPSULE | Freq: Three times a day (TID) | ORAL | 0 refills | Status: DC

## 2018-07-18 MED ORDER — OMEPRAZOLE 40 MG PO CPDR: 40.0000 mg | DELAYED_RELEASE_CAPSULE | Freq: Two times a day (BID) | ORAL | 0 refills | Status: DC

## 2018-07-19 ENCOUNTER — Telehealth: Payer: Self-pay

## 2018-07-19 MED ORDER — BISMUTH SUBSALICYLATE 262 MG PO CHEW: 524.0000 mg | CHEWABLE_TABLET | Freq: Four times a day (QID) | ORAL | 0 refills | Status: AC

## 2018-07-19 MED ORDER — METRONIDAZOLE 250 MG PO TABS: 250.0000 mg | ORAL_TABLET | Freq: Four times a day (QID) | ORAL | 0 refills | Status: AC

## 2018-07-19 MED ORDER — DOXYCYCLINE HYCLATE 100 MG PO TABS: 100.0000 mg | ORAL_TABLET | Freq: Two times a day (BID) | ORAL | 0 refills | Status: AC

## 2018-07-19 NOTE — Telephone Encounter (Signed)
I called and informed the pharmacy what we had done and she will put a note to inform patient of the change.

## 2018-07-19 NOTE — Telephone Encounter (Signed)
Pylera not covered by his insurance so breaking it into a treatment of : flagyl, doxycycline, pepto bismol and omeprazole . I have left him a detailed message that this has been done and to call us with questions.

## 2018-08-06 ENCOUNTER — Encounter: Payer: Self-pay | Admitting: *Deleted

## 2018-08-07 ENCOUNTER — Other Ambulatory Visit: Payer: Self-pay

## 2018-08-07 ENCOUNTER — Encounter: Payer: Self-pay | Admitting: Gastroenterology

## 2018-08-07 ENCOUNTER — Ambulatory Visit (INDEPENDENT_AMBULATORY_CARE_PROVIDER_SITE_OTHER): Payer: BLUE CROSS/BLUE SHIELD | Admitting: Gastroenterology

## 2018-08-07 VITALS — Ht 76.0 in | Wt 235.0 lb

## 2018-08-07 DIAGNOSIS — N186 End stage renal disease: Secondary | ICD-10-CM

## 2018-08-07 DIAGNOSIS — K2970 Gastritis, unspecified, without bleeding: Secondary | ICD-10-CM | POA: Diagnosis not present

## 2018-08-07 DIAGNOSIS — B9681 Helicobacter pylori [H. pylori] as the cause of diseases classified elsewhere: Secondary | ICD-10-CM

## 2018-08-07 DIAGNOSIS — D500 Iron deficiency anemia secondary to blood loss (chronic): Secondary | ICD-10-CM

## 2018-08-07 DIAGNOSIS — D638 Anemia in other chronic diseases classified elsewhere: Secondary | ICD-10-CM

## 2018-08-07 NOTE — Progress Notes (Signed)
Chad Adkins    [id number]    02-23-1965  Primary Care Physician:Patient, No Pcp Per  Referring Physician: No referring provider defined for this encounter.  This service was provided via audio and video telemedicine (D[employer]) due to [place] 19 pandemic.  Patient location: Home Provider location: Office Used 2 patient identifiers to confirm the correct person. Explained the limitations in evaluation and management via telemedicine. Patient is aware of potential medical charges for this visit.  Patient consented to this virtual visit.  The persons participating in this telemedicine service were myself and the patient  Time spent: 12 minutes  Chief complaint: Iron deficiency anemia HPI: 53 year old male with ESRD on dialysis, iron deficiency anemia heme positive stool here for follow-up visit after EGD and colonoscopy Colonoscopy Jul 01, 2018 with removal of 11 mm polyp from sigmoid colon  EGD 01/10/2019 with gastritis, H. pylori positive and prepyloric ulcerated nodule negative for malignancy or dysplasia  He is finishing up antibiotics for H. pylori.  Tolerating them well. Denies any nausea, vomiting, abdominal pain, melena or bright red blood per rectum   Previous HPI May 23, 2018 52 yr M with HTN, TIA 4 years ago, ESRD on HD initiated last year referred for evaluation of fecal Hemoccult positive and chronic anemia. He has had evaluation for possible renal transplant but currently not on renal transplant list due to insurance/financial reasons.  He has intermittent dark stool.  Takes Nephro-Vite with oral iron tablets daily Denies any nausea, vomiting, abdominal pain, melena or bright red blood per rectum  He has chronic anemia with hemoglobin ranging 8-9 even going back based on labs in 2018.  No evidence of iron deficiency.   Never had EGD or colonoscopy  No family history of GI malignancy  April 24, 2018 hemoglobin 8.8, hematocrit 28.6, BUN  62, creatinine 18, reticulocyte count 341, PTH elevated at 257.  LFT and iron panel within normal limits.  Hemoglobin 9.4 and hematocrit 28 May 06, 2018  Hemoglobin dropped to 8.3 and hematocrit 24.9 on May 15, 2018.  Iron 75, TIBC 256, iron saturation 29% fecal Hemoccult cards 2 out of 3 positive   Outpatient Encounter Medications as of 08/07/2018  Medication Sig  . amLODipine (NORVASC) 10 MG tablet Take 1 tablet (10 mg total) by mouth daily.  Marland Kitchen b complex-vitamin c-folic acid (NEPHRO-VITE) 0.8 MG TABS tablet Take 1 tablet at bedtime by mouth.  . calcitRIOL (ROCALTROL) 0.25 MCG capsule Take by mouth daily.  . calcium acetate (PHOSLO) 667 MG capsule Take 667-1,334 mg See admin instructions by mouth. Take 1 capsule (667 mg) by mouth with snacks and 2 capsule (1334 mg) with each meal.  . carvedilol (COREG) 25 MG tablet Take 1 tablet (25 mg total) by mouth 2 (two) times daily with a meal. (Patient taking differently: Take 12.5 mg by mouth 2 (two) times daily with a meal. )  . metroNIDAZOLE (FLAGYL) 250 MG tablet Take 1 tablet (250 mg total) by mouth 4 (four) times daily.  . polyethylene glycol (MIRALAX / GLYCOLAX) 17 g packet Take 17 g by mouth as needed.   No facility-administered encounter medications on file as of 08/07/2018.     Allergies as of 08/07/2018 - Review Complete 08/07/2018  Allergen Reaction Noted  . Tape Rash 06/20/2018    Past Medical History:  Diagnosis Date  . Abnormal ECG 07/16/2012   echo--- [facility]  . Anemia   . Chronic kidney disease, stage  V [facility]) 2018   Hemodialysis - [facility]  . CRF (chronic renal failure) 2018  . GERD (gastroesophageal reflux disease)   . H/O hyperkalemia   . Hypertension   . Kidney failure, acute ([place]) 2018    Past Surgical History:  Procedure Laterality Date  . AV FISTULA PLACEMENT Right 10/17/2016   Procedure: ARTERIOVENOUS (AV) FISTULA CREATION RIGHT ARM;  Surgeon: Angelia Mould, MD;  Location: [facility] OR;   Service: Vascular;  Laterality: Right;  . AV FISTULA PLACEMENT Right 01/12/2017   Procedure: INSERTION OF ARTERIOVENOUS (AV) GORE-TEX STRETCH 4-7MM GRAFT INTO RIGHT ARM;  Surgeon: Angelia Mould, MD;  Location: [facility];  Service: Vascular;  Laterality: Right;  . BASCILIC VEIN TRANSPOSITION Right 12/07/2016   Procedure: right arm 2ND STAGE BASCILIC VEIN TRANSPOSITION with revision and interposition gortex PTFE graft;  Surgeon: Angelia Mould, MD;  Location: [facility];  Service: Vascular;  Laterality: Right;  . HERNIA REPAIR Left    inguinal- was a baby  . INSERTION OF DIALYSIS CATHETER Right 12/07/2016   Procedure: INSERTION OF right internal jugular DIALYSIS CATHETER with ultrasound guided access;  Surgeon: Angelia Mould, MD;  Location: [facility];  Service: Vascular;  Laterality: Right;  . PORTA CATH REMOVAL    . ROTATOR CUFF REPAIR Right     Family History  Problem Relation Age of Onset  . Hyperlipidemia Father   . Hypertension Mother   . Breast cancer Mother   . CAD Neg Hx   . Diabetes Neg Hx   . Stroke Neg Hx   . Colon cancer Neg Hx   . Prostate cancer Neg Hx   . Rectal cancer Neg Hx   . Stomach cancer Neg Hx     Social History   Socioeconomic History  . Marital status: Single    Spouse name: Not on file  . Number of children: 2  . Years of education: Not on file  . Highest education level: Not on file  Occupational History  . Occupation: Freight forwarder , [employer]   Social Needs  . Financial resource strain: Not on file  . Food insecurity    Worry: Not on file    Inability: Not on file  . Transportation needs    Medical: Not on file    Non-medical: Not on file  Tobacco Use  . Smoking status: Current Every Day Smoker    Packs/day: 4.00    Years: 20.00    Pack years: 80.00    Types: Cigars  . Smokeless tobacco: Never Used  Substance and Sexual Activity  . Alcohol use: Not Currently    Alcohol/week: 0.0 standard drinks    Comment: rare  . Drug use:  No  . Sexual activity: Not on file  Lifestyle  . Physical activity    Days per week: Not on file    Minutes per session: Not on file  . Stress: Not on file  Relationships  . Social Herbalist on phone: Not on file    Gets together: Not on file    Attends religious service: Not on file    Active member of club or organization: Not on file    Attends meetings of clubs or organizations: Not on file    Relationship status: Not on file  . Intimate partner violence    Fear of current or ex partner: Not on file    Emotionally abused: Not on file    Physically abused: Not on file  Forced sexual activity: Not on file  Other Topics Concern  . Not on file  Social History Narrative   Lives by himself       Review of systems: Review of Systems as per HPI All other systems reviewed and are negative.   Physical Exam: Vitals were not taken and physical exam was not performed during this virtual visit.  Data Reviewed:  Reviewed labs, radiology imaging, old records and pertinent past GI work up   Assessment and Plan/Recommendations:  53 year old male with end-stage renal disease, chronic anemia with worsening iron deficiency and heme positive stool  EGD with H. pylori gastritis, ulcerated nodular lesion in prepyloric stomach, biopsies negative for malignancy or dysplasia  Advised patient to complete the course of antibiotics  We will check H. pylori stool antigen after completion of antibiotics off PPI for 2 weeks to document eradication  Repeat EGD in 3 months to document healing of the ulcer and nodularity in prepyloric region  Due for colonoscopy for surveillance with history of adenomatous polyps in 3 years May 2023    K. Denzil Magnuson , MD   CC: No ref. provider found

## 2018-08-07 NOTE — Patient Instructions (Addendum)
Check H. pylori stool antigen in 2 weeks after completion of antibiotics on PPI   Your orders are in for your labs, Come to [address] basement level to get your kit   Follow-up as needed  I appreciate the  opportunity to care for you  Thank You   Kavitha Nandigam , MD  

## 2018-08-16 ENCOUNTER — Telehealth: Payer: Self-pay

## 2018-08-16 NOTE — Telephone Encounter (Signed)
Called patient and sent message on My Chart, to come in for H-Pylori Stool test in 2 weeks

## 2018-08-16 NOTE — Telephone Encounter (Signed)
-----   Message from Hughie Closs, RN sent at 07/18/2018 12:56 PM EDT ----- Call patient and have him stop PPI , if he is still on it. To have stool test in 2 wks. For eradication of H Pylori

## 2018-08-27 IMAGING — MR MR HEAD W/O CM
9 of 10 series · 36 of 48 positions shown · non-contrast
Comparison: CT HEAD June 24, 2016 and MRI of the head August 27, 2012

CLINICAL DATA: Syncope, follow-up abnormal CT. History of
hypertension, kidney failure and hyperlipidemia.

EXAM:
MRI HEAD WITHOUT CONTRAST
TECHNIQUE: Multiplanar, multiecho pulse sequences of the brain and surrounding
structures were obtained without intravenous contrast.

[Series 2: FLAIR · sagittal · 5.0mm · 0.47mm/px · 2 of 23 slices shown (1 of 2)]
[im 1/23]
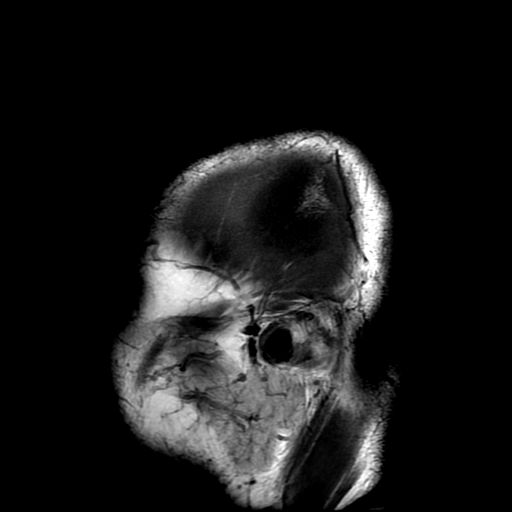
[im 23/23]
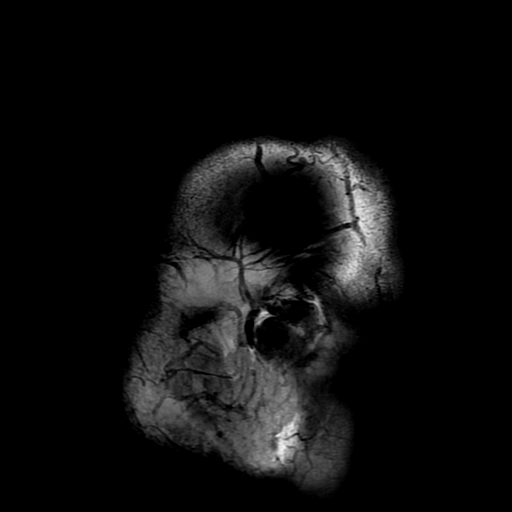

[Series 4: DWI · axial · 3.0mm · 0.94mm/px · z∈[-38,+106]mm · 9 of 100 slices shown (1 of 2)]
[im 1/100]
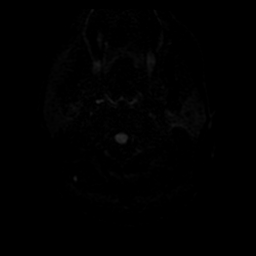
[im 13/100]
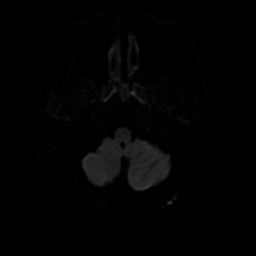
[im 25/100]
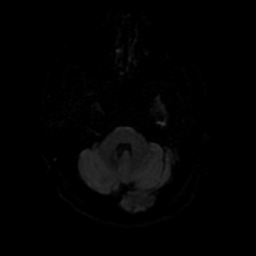
[im 38/100]
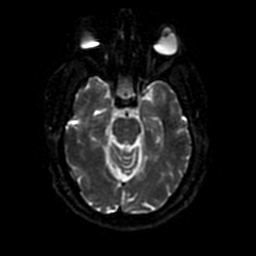
[im 50/100]
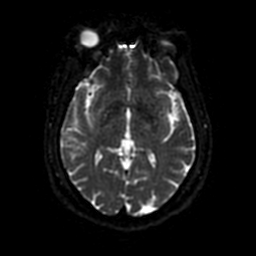
[im 62/100]
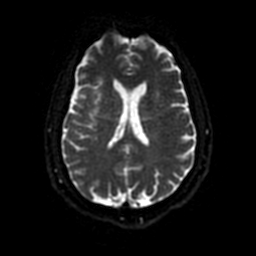
[im 75/100]
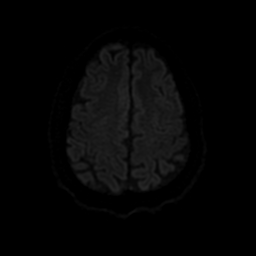
[im 87/100]
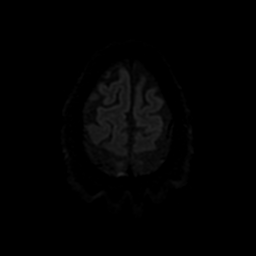
[im 100/100]
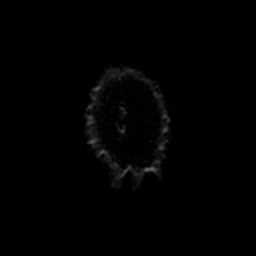

[Series 5: T2 · axial · 5.0mm · 0.47mm/px · z∈[-38,+108]mm · 2 of 26 slices shown (1 of 2)]
[im 1/26]
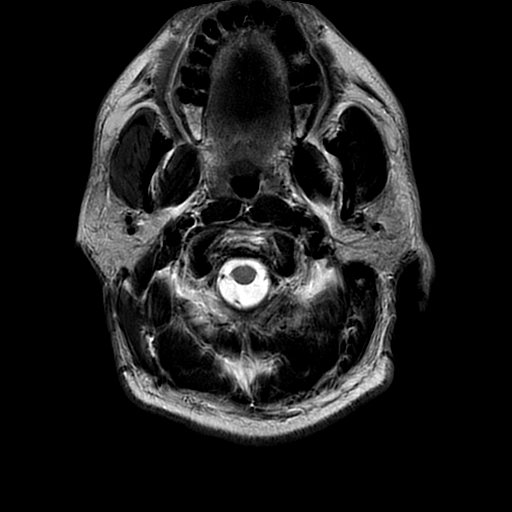
[im 26/26]
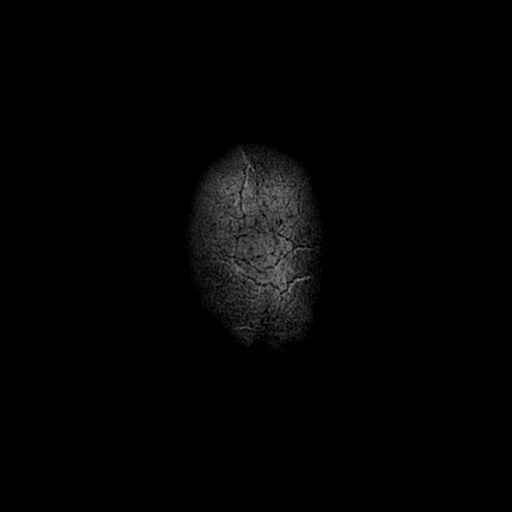

[Series 6: FLAIR · axial · 5.0mm · 0.47mm/px · z∈[-38,+108]mm · 2 of 26 slices shown (2 of 2)]
[im 1/26]
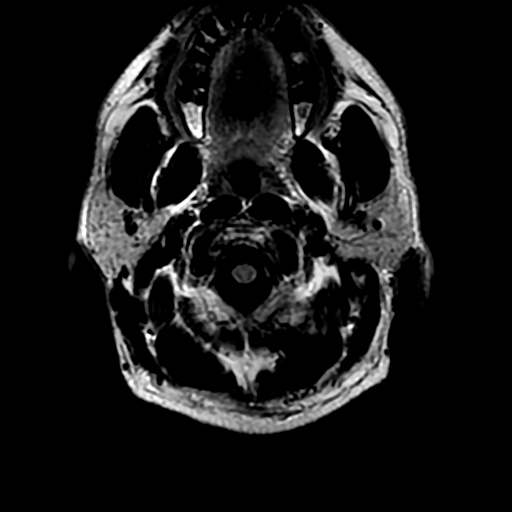
[im 26/26]
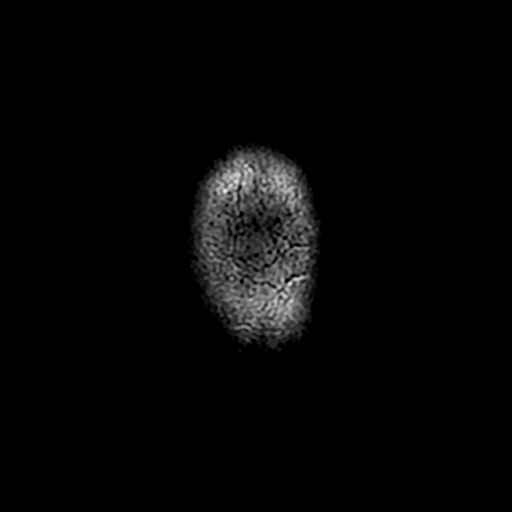

[Series 7: DWI · coronal · 4.0mm · 0.94mm/px · 7 of 72 slices shown (2 of 2)]
[im 1/72]
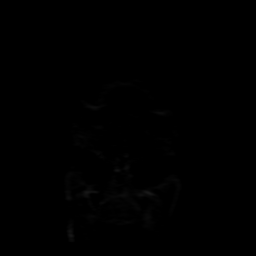
[im 12/72]
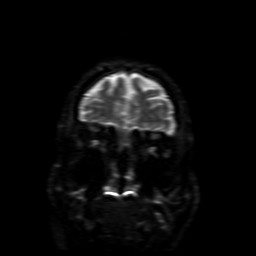
[im 24/72]
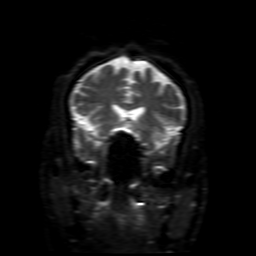
[im 36/72]
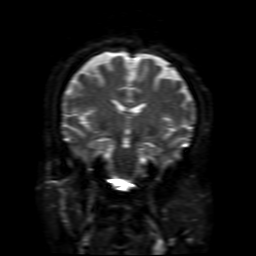
[im 48/72]
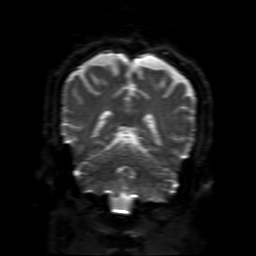
[im 60/72]
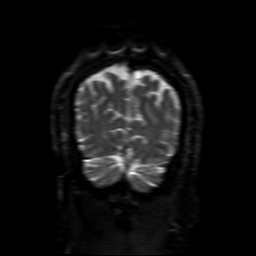
[im 72/72]
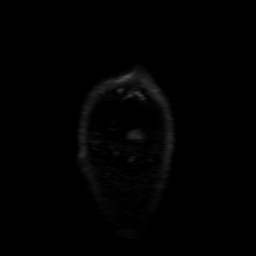

[Series 8: (person_name) · axial · 3.0mm · 0.47mm/px · z∈[-40,+10]mm · 3 of 104 slices shown]
[im 1/104]
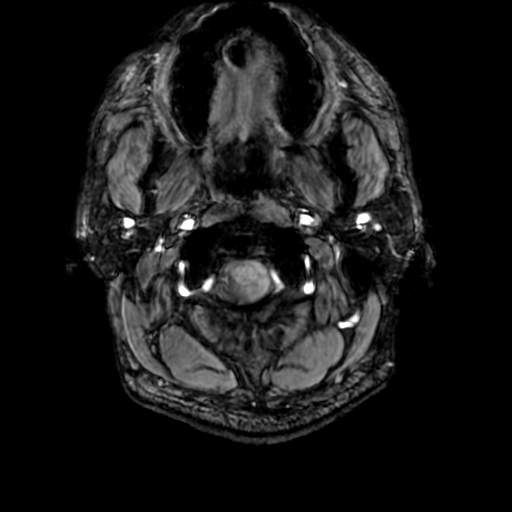
[im 12/104]
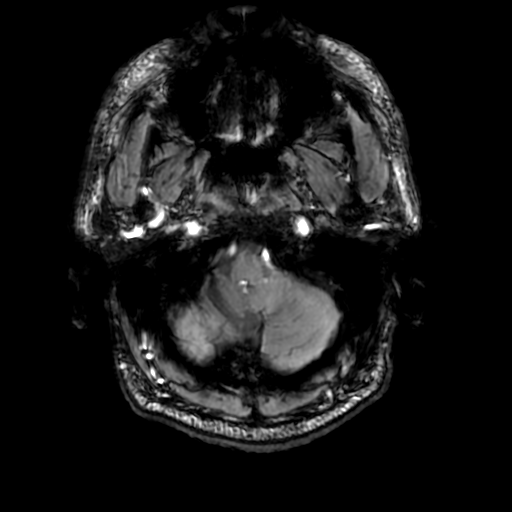
[im 35/104]
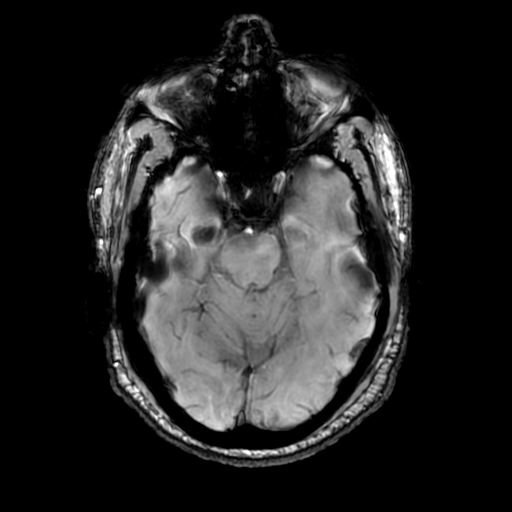

[Series 10: T2 · coronal · 5.0mm · 0.47mm/px · 3 of 30 slices shown (2 of 2)]
[im 1/30]
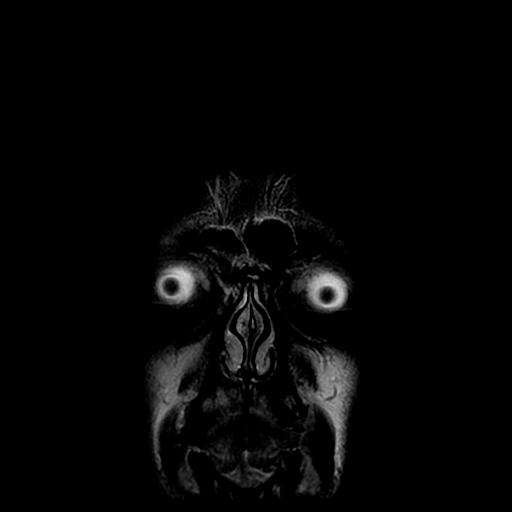
[im 15/30]
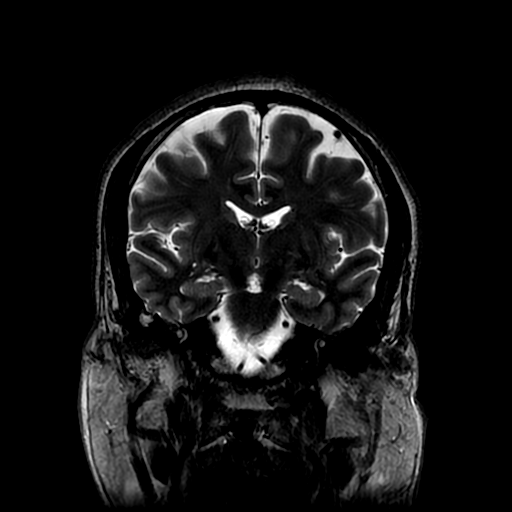
[im 30/30]
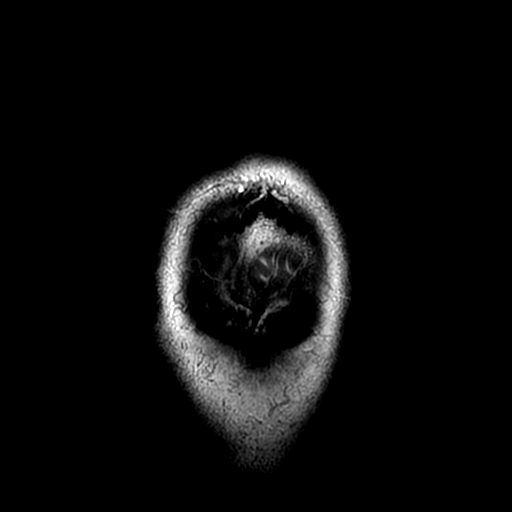

[Series 450: ADC · axial · 3.0mm · 0.94mm/px · z∈[-38,+106]mm · 5 of 50 slices shown (1 of 2)]
[im 1/50]
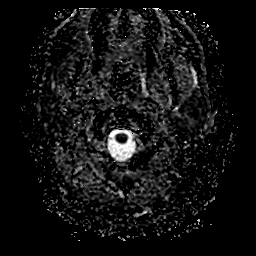
[im 13/50]
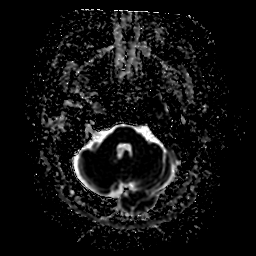
[im 25/50]
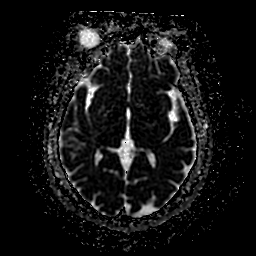
[im 37/50]
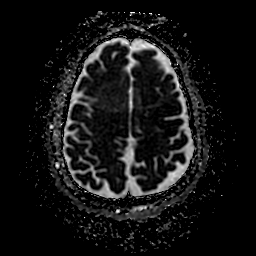
[im 50/50]
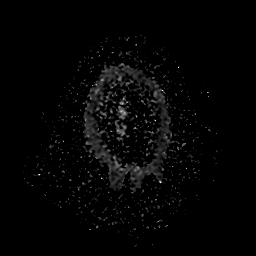

[Series 750: ADC · coronal · 4.0mm · 0.94mm/px · 3 of 36 slices shown (2 of 2)]
[im 1/36]
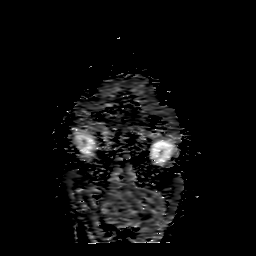
[im 18/36]
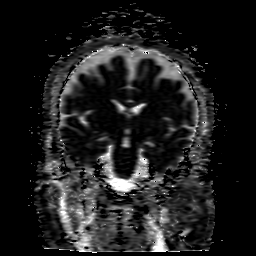
[im 36/36]
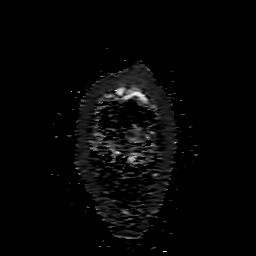

[36 of 48 positions shown; findings below may reference images not displayed]

FINDINGS: BRAIN: No reduced diffusion to suggest acute ischemia. No
susceptibility artifact to suggest hemorrhage. The ventricles and
sulci are normal for patient's age. No suspicious parenchymal
signal, masses or mass effect. No abnormal extra-axial fluid
collections.

VASCULAR: Normal major intracranial vascular flow voids present at
skull base.

SKULL AND UPPER CERVICAL SPINE: No abnormal sellar expansion. No
suspicious calvarial bone marrow signal. Craniocervical junction
maintained. Severe RIGHT temporomandibular osteoarthrosis.

SINUSES/ORBITS: The mastoid air-cells and included paranasal sinuses
are well-aerated. The included ocular globes and orbital contents
are non-suspicious.

OTHER: None.
IMPRESSION: Normal noncontrast MRI head ; CT finding was artifact.

## 2018-08-30 ENCOUNTER — Other Ambulatory Visit: Payer: BLUE CROSS/BLUE SHIELD

## 2018-09-02 ENCOUNTER — Other Ambulatory Visit: Payer: BLUE CROSS/BLUE SHIELD

## 2018-09-02 DIAGNOSIS — B9681 Helicobacter pylori [H. pylori] as the cause of diseases classified elsewhere: Secondary | ICD-10-CM

## 2018-09-02 DIAGNOSIS — K2970 Gastritis, unspecified, without bleeding: Secondary | ICD-10-CM

## 2018-09-03 LAB — HELICOBACTER PYLORI  SPECIAL ANTIGEN
MICRO NUMBER:: 660382
SPECIMEN QUALITY: ADEQUATE

## 2018-09-04 ENCOUNTER — Telehealth: Payer: Self-pay

## 2018-09-04 NOTE — Telephone Encounter (Signed)
NOTES ON FILE FROM [facility] [phone], SENT REFERRAL TO SCHEDULING 

## 2018-09-21 ENCOUNTER — Encounter: Payer: Self-pay | Admitting: Gastroenterology

## 2018-12-31 ENCOUNTER — Encounter: Payer: Self-pay | Admitting: General Practice

## 2021-08-14 ENCOUNTER — Encounter: Payer: Self-pay | Admitting: Gastroenterology
